# Patient Record
Sex: Female | Born: 1965 | Race: White | Hispanic: No | Marital: Married | Smoking: Never smoker
Health system: Southern US, Community
[De-identification: ages and names within clinical notes are randomized; demographics above are authoritative.]

## PROBLEM LIST (undated history)

## (undated) DIAGNOSIS — J45909 Unspecified asthma, uncomplicated: Secondary | ICD-10-CM

## (undated) DIAGNOSIS — Z9109 Other allergy status, other than to drugs and biological substances: Secondary | ICD-10-CM

## (undated) DIAGNOSIS — Z91018 Allergy to other foods: Secondary | ICD-10-CM

## (undated) DIAGNOSIS — Z79890 Hormone replacement therapy (postmenopausal): Secondary | ICD-10-CM

## (undated) DIAGNOSIS — R112 Nausea with vomiting, unspecified: Secondary | ICD-10-CM

## (undated) DIAGNOSIS — T8859XA Other complications of anesthesia, initial encounter: Secondary | ICD-10-CM

## (undated) DIAGNOSIS — N809 Endometriosis, unspecified: Secondary | ICD-10-CM

## (undated) DIAGNOSIS — F419 Anxiety disorder, unspecified: Secondary | ICD-10-CM

## (undated) HISTORY — PX: ABDOMINAL HYSTERECTOMY: SHX81

## (undated) HISTORY — PX: DILATION AND CURETTAGE OF UTERUS: SHX78

## (undated) HISTORY — PX: LAPAROSCOPY: SHX197

## (undated) HISTORY — PX: CERVICAL DISCECTOMY: SHX98

## (undated) HISTORY — PX: TONSILLECTOMY: SUR1361

---

## 1997-12-29 ENCOUNTER — Ambulatory Visit (HOSPITAL_COMMUNITY): Admission: RE | Admit: 1997-12-29 | Discharge: 1997-12-29 | Payer: Self-pay | Admitting: *Deleted

## 1998-05-27 ENCOUNTER — Other Ambulatory Visit: Admission: RE | Admit: 1998-05-27 | Discharge: 1998-05-27 | Payer: Self-pay | Admitting: *Deleted

## 1998-12-14 ENCOUNTER — Inpatient Hospital Stay (HOSPITAL_COMMUNITY): Admission: AD | Admit: 1998-12-14 | Discharge: 1998-12-17 | Payer: Self-pay | Admitting: *Deleted

## 1998-12-14 ENCOUNTER — Encounter (INDEPENDENT_AMBULATORY_CARE_PROVIDER_SITE_OTHER): Payer: Self-pay

## 1999-07-04 ENCOUNTER — Other Ambulatory Visit: Admission: RE | Admit: 1999-07-04 | Discharge: 1999-07-04 | Payer: Self-pay | Admitting: *Deleted

## 1999-07-07 ENCOUNTER — Encounter: Payer: Self-pay | Admitting: *Deleted

## 1999-07-07 ENCOUNTER — Ambulatory Visit (HOSPITAL_COMMUNITY): Admission: RE | Admit: 1999-07-07 | Discharge: 1999-07-07 | Payer: Self-pay | Admitting: *Deleted

## 1999-11-14 ENCOUNTER — Encounter (INDEPENDENT_AMBULATORY_CARE_PROVIDER_SITE_OTHER): Payer: Self-pay

## 1999-11-14 ENCOUNTER — Other Ambulatory Visit: Admission: RE | Admit: 1999-11-14 | Discharge: 1999-11-14 | Payer: Self-pay | Admitting: Otolaryngology

## 2000-06-29 ENCOUNTER — Observation Stay (HOSPITAL_COMMUNITY): Admission: AD | Admit: 2000-06-29 | Discharge: 2000-06-30 | Payer: Self-pay | Admitting: *Deleted

## 2000-06-29 ENCOUNTER — Encounter (INDEPENDENT_AMBULATORY_CARE_PROVIDER_SITE_OTHER): Payer: Self-pay

## 2001-01-01 ENCOUNTER — Other Ambulatory Visit: Admission: RE | Admit: 2001-01-01 | Discharge: 2001-01-01 | Payer: Self-pay | Admitting: *Deleted

## 2002-01-30 ENCOUNTER — Other Ambulatory Visit: Admission: RE | Admit: 2002-01-30 | Discharge: 2002-01-30 | Payer: Self-pay | Admitting: *Deleted

## 2004-03-10 ENCOUNTER — Encounter: Admission: RE | Admit: 2004-03-10 | Discharge: 2004-03-10 | Payer: Self-pay | Admitting: Obstetrics and Gynecology

## 2005-04-05 ENCOUNTER — Encounter: Admission: RE | Admit: 2005-04-05 | Discharge: 2005-04-05 | Payer: Self-pay | Admitting: Obstetrics and Gynecology

## 2006-07-03 ENCOUNTER — Encounter: Admission: RE | Admit: 2006-07-03 | Discharge: 2006-07-03 | Payer: Self-pay | Admitting: Obstetrics and Gynecology

## 2010-02-07 ENCOUNTER — Encounter: Payer: Self-pay | Admitting: Obstetrics and Gynecology

## 2010-06-03 NOTE — Op Note (Signed)
[facility]  Patient:    Sophia Blair, Sophia Blair                    MRN: [id number] Proc. Date: 06/29/00 Adm. Date:  [id number] Attending:  Pleas Koch                           Operative Report  PREOPERATIVE DIAGNOSES:       Endometriosis, pelvic pain, dyspareunia.  POSTOPERATIVE DIAGNOSES:      Endometriosis, pelvic pain, dyspareunia.  OPERATION PERFORMED:          Laparoscopically assisted vaginal hysterectomy, bilateral salpingo-oophorectomy.  SURGEON:                      Georgina Peer, M.D.  ASSISTANT:                    Rudy Jew. Ashley Royalty, M.D.  ANESTHESIA:                   General.  BLOOD LOSS:                   100 cc.  COMPLICATIONS:                None.  INDICATIONS:                  This is a 45 year old gravida 2, para 2, with a history of endometriosis dating back to 24.  The patient had a repeat laparoscopy in 1999 with treatment of endometriosis of the left ovary and left ovarian fossa.  After having a delivery in 2001, Lupron Depot was given to control symptoms but after the Lupron Depot wore off, the patients symptoms returned.  She elected aggressive surgical approach for management as she did not want further children.  For details, see history and physical.  DESCRIPTION OF PROCEDURE:     The patient was taken to the operating room, given a general anesthetic and placed in dorsal lithotomy position with the legs in Allen stirrups.  PAS stockings were applied.  Abdomen and vagina were prepped and draped sterilely.  Her bladder was emptied with a catheter.  The patient had then a vertical subumbilical incision made, Veress needle placed into the peritoneal cavity, insufflation after a normal water-drop test under low pressures of 2.5 L of CO2 created a pneumoperitoneum.  Laparoscopic trocar and sleeve were placed and then under direct vision, 10-11 trocars were placed in the right and left lower quadrants and a 5-mm  trocar was placed in the midline.  The following pelvic findings were noted:  There appeared to be no injury from laparoscopic trocar placement.  The uterus appeared normal and mobile.  There was no evidence of endometriosis in the anterior bladder flap. Some scarring in the cul-de-sac was noted and scarring in the left ovarian fossa with retraction pockets in the left ovarian fossa.  Ovaries were not enlarged and were not adherent to the pelvic sidewall.  Using a Ligasure laparoscopic 10-mm instrument, the right infundibulopelvic vessels were seen free of the ureter, which was fully identified.  The infundibulopelvic vessels were ligated with the Ligasure device, the round ligaments and cardinal ligaments ligated with the Ligasure device.  An anterior bladder flap was created with the Ligasure device.  Similar identification of the left infundibulopelvic ligaments free from the left ureter and free from the reflection of the  rectosigmoid was then performed and the vessels sealed with the Ligasure progressively down to the level of the bladder flap.  The bladder flap was then created; the laparoscopic portion of the surgery was then suspended.  The vaginal approach was then begun, injecting the vaginal mucosa with a total of 15 cc of a dilute Neo-Synephrine solution.  Cautery was used to incise the vaginal mucosa.  The cul-de-sac was entered posteriorly.  A retractor was placed.  The uterosacral ligaments were divided with the Ligasure device. Anteriorly, the anterior vaginal mucosa and bladder were pushed up and the peritoneum was identified from the previous bladder flap that was created laparoscopically.  The bladder was then retracted anteriorly and cephalad. The uterine vessels bilaterally were sealed with the Ligasure device.  The broad ligament attachments of the uterus were then sealed with the Ligasure device and the uterus, tubes and ovaries were removed.  Small bleeders  along the vaginal cuff were sealed with the Ligasure device.  Uterosacral ligaments were then sutured.  The posterior cuff was whipstitched.  There was excellent hemostasis.  The vaginal cuff was reapproximated with Vicryl suture to close the cuff.  There was excellent hemostasis again.  The uterosacral ligaments were then tied together.  Inspection laparoscopically for any bleeders after the vaginal cuff was closed was then accomplished.  Irrigation of any blood and debris was accomplished.  All pedicles appeared hemostatic.  There was no bleeding.  All blood and debris were removed.  The appendix was visualized and found to be normal.  Photo-documentation of these findings was made and then the laparoscope and ports were removed.  The 10-mm ports fasciae were closed with a Vicryl suture and the skin was closed subcuticular in individual sutures of Vicryl.  A Foley catheter was placed.  Sponge, needle and instrument counts were correct.  The patient did receive Ancef 1 g preoperatively. DD:  06/29/00 TD:  06/29/00 Job: [id number]

## 2010-06-03 NOTE — Discharge Summary (Signed)
[facility]  Patient:    Sophia Blair, Sophia Blair                    MRN: [id number] Adm. Date:  [id number] Disc. Date: [id number] Attending:  Pleas Koch                           Discharge Summary  ADMISSION DIAGNOSES:          1. Endometriosis.                               2. Dyspareunia.                               3. Pelvic pain.  DISCHARGE DIAGNOSES:          1. Endometriosis.                               2. Dyspareunia.                               3. Pelvic pain.  BRIEF HISTORY:                This is a gravida 2, para 2, with a long history of endometriosis failing medical therapy with Lupron Depot, desired aggressive and complete management of her problem. She had previously had a laparoscopy in 1999. She was admitted and underwent a laparoscopically assisted vaginal hysterectomy with bilateral salpingo-oophorectomy under general anesthetic on June 14 with blood loss of 100 cc. There were no complications. Postoperatively, the patients catheter was removed on the evening of the day of surgery and she voided spontaneously. She was taking liquids, and by the morning of the first day she was taking solids. She continued on PCA and IM Toradol until the morning of the first postoperative day. Her postoperative hemoglobin was 11.6. She was passing flatus, she had no distention, nausea or vomiting, and appropriate shoulder pain for having a laparoscopy. She was having minimal vaginal bleeding. She was ambulating without lightheadedness or dizziness, and postoperative hemoglobin, again, 11.6 from preoperative of 14.1. The patient had her PCA pumped stopped on the morning of the first day and by the afternoon of the first day, June 15, was taking oral analgesics, tolerating them well; again, tolerating solid food and was ready for discharge.  DISPOSITION:                  She was discharged in good condition.  DISCHARGE MEDICATIONS:         She was given a prescription for Percocet, Ibuprofen over-the-counter. A Vivelle 0.1 mg patch was applied on the first day and this will be changed every three days. A prescription was written for this.  DISCHARGE INSTRUCTIONS:       She was given detailed written instructions to call for fever, heavy bleeding, increased pain, and inability to void or problems with her incision.  DISCHARGE FOLLOWUP:           She will follow up in the office in two weeks. DD:  07/01/00 TD:  07/01/00 Job: [id number]

## 2010-06-03 NOTE — H&P (Signed)
[facility]  Patient:    Sophia Blair, Sophia Blair                      MRN: [id number] Adm. Date:  06/29/00 Attending:  Georgina Peer, M.D. CC:         Day Surgery   History and Physical  ADMITTING DIAGNOSIS:          Endometriosis, pelvic pain, dyspareunia.  HISTORY OF PRESENT ILLNESS:   This is a 45 year old female who has a known history of endometriosis.  She had a laparoscopy in 1999 for pelvic pain with left ovarian adhesions and endometriosis that was vaporized.  The patient subsequently got pregnant and had a baby.  She had increasing pelvic tenderness.  She was placed on Depo-Lupron, but this has not improved her symptoms.  The patient desires aggressive and definitive treatment for this problem and is admitted for LAVH-BSO.  The patient had been previously on oral contraceptives and nonsteroidal anti-inflammatory agents but continued to have severe left-sided pain and severe dysmenorrhea.  Also, intercourse was increasingly uncomfortable and disruptive.  PAST MEDICAL HISTORY:         She is a gravida 2, para 2.  The patient has no history of thyroid disease or breast disease.  The patient had her first diagnosis of endometriosis in 1994.  SOCIAL HISTORY:               The patient does not smoke cigarettes, social alcohol, and no recreational drugs.  FAMILY HISTORY:               Positive for diabetes, hypertension, heart disease, heart attack.  ALLERGIES:                    She has seasonal allergies.  The patient is allergic to sulfa,  shellfish, and tomatoes.  REVIEW OF SYSTEMS:            Otherwise is negative and there is no personal history of hypertension, diabetes, heart, lung, or kidney disease.  The patient has no history of STDs.  She has had a recent Pap smear which was normal in June 2001.  PHYSICAL EXAMINATION:  GENERAL:                      A well-developed white female.  VITAL SIGNS:                  Blood pressure  94/62, weight 115, height 5 feet, 4-1/2 inches.  HEENT:                        Within normal limits.  NECK:                         Without thyromegaly or JVD.  CHEST:                        Clear.  BREASTS:                      Normal.  ABDOMEN:                      Normal with left lower quadrant tenderness.  No rebound, mass, or guarding.  No CVA tenderness.  EXTREMITIES:  Without cyanosis or edema.  NEUROLOGIC:                   Grossly normal.  PELVIC:                       EG, BUS normal.  Vagina normal.  Cervix nontender with no lesions.  Uterus is tender in the cul de sac, tenderness on the anterior and posterior surfaces, but the uterus is mobile.  Left adnexa is tender.  There are no masses, no nodularity or fixation.  RECTOVAGINAL:                 Normal without nodularity.  ADMISSION DIAGNOSIS:          Recurrent endometriosis failing medical management, pelvic pain, dyspareunia, and dysmenorrhea.  PLAN:                         The patient is admitted for laparoscopically assisted vaginal hysterectomy, bilateral salpingo-oophorectomy.  She is aware of the risks and complications of procedure including risk of laparoscopy, bowel, bladder, or vascular injury, the risks of bleeding or infection, the risks of fistula formation.  The chance that pain may not be completely resolved or, actually, may be worse after the surgery, that endometriosis could recur even after ovarian removal and that hormone replacement therapy may be a recommended postoperative path.  She accepts these and is willing to proceed.  Will proceed with surgery at [facility] on June 14 at 8:45 a.m. DD:  06/28/00 TD:  06/28/00 Job: [id number]

## 2016-12-20 ENCOUNTER — Encounter: Payer: Self-pay | Admitting: Podiatry

## 2016-12-20 ENCOUNTER — Ambulatory Visit: Payer: BLUE CROSS/BLUE SHIELD | Admitting: Podiatry

## 2016-12-20 VITALS — BP 131/99 | HR 87 | Resp 16

## 2016-12-20 DIAGNOSIS — L600 Ingrowing nail: Secondary | ICD-10-CM | POA: Diagnosis not present

## 2016-12-20 NOTE — Progress Notes (Signed)
   Subjective:    Patient ID: Sophia Blair, female    DOB: 07/02/1965, 51 y.o.   MRN: [id number]  HPI    Review of Systems  All other systems reviewed and are negative.      Objective:   Physical Exam        Assessment & Plan:

## 2016-12-20 NOTE — Progress Notes (Signed)
Subjective:   Patient ID: Sophia Blair, female   DOB: 51 y.o.   MRN: [id number]   HPI Patient presents with chronic ingrown toenails of the big toes bilateral stating they have been sore and making it hard for her to wear shoe gear comfortably.  She is tried to trim them without relief of symptoms and they are very painful when palpated   Review of Systems  All other systems reviewed and are negative.       Objective:  Physical Exam  Constitutional: She appears well-developed and well-nourished.  Cardiovascular: Intact distal pulses.  Pulmonary/Chest: Effort normal.  Musculoskeletal: Normal range of motion.  Neurological: She is alert.  Skin: Skin is warm.  Nursing note and vitals reviewed.   Neurovascular status intact muscle strength adequate range of motion within normal limits with patient found to have incurvated hallux nails of both feet both medial lateral borders they are very painful when pressed.  Patient has slight distal redness but no active drainage or no proximal edema erythema or drainage noted and patient was found to have good digital perfusion and is well oriented x3.  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  Patient wants procedure and I explained the risks associated with surgery and patient had each hallux infiltrated 60 mg like Marcaine mixture and under sterile conditions I removed the medial lateral borders exposed matrix and applied phenol 3 applications 30 seconds followed by alcohol lavage sterile dressing.  Gave instructions on soaks and reappoint as needed and is encouraged to call with any questions concerning this procedure

## 2016-12-20 NOTE — Patient Instructions (Signed)

## 2017-03-02 ENCOUNTER — Other Ambulatory Visit: Payer: Self-pay

## 2017-03-02 ENCOUNTER — Encounter (HOSPITAL_BASED_OUTPATIENT_CLINIC_OR_DEPARTMENT_OTHER): Payer: Self-pay | Admitting: Emergency Medicine

## 2017-03-02 ENCOUNTER — Emergency Department (HOSPITAL_BASED_OUTPATIENT_CLINIC_OR_DEPARTMENT_OTHER)
Admission: EM | Admit: 2017-03-02 | Discharge: 2017-03-02 | Disposition: A | Discharge status: Home / Self Care | Payer: BLUE CROSS/BLUE SHIELD | Attending: Emergency Medicine | Admitting: Emergency Medicine

## 2017-03-02 ENCOUNTER — Emergency Department (HOSPITAL_BASED_OUTPATIENT_CLINIC_OR_DEPARTMENT_OTHER): Payer: BLUE CROSS/BLUE SHIELD

## 2017-03-02 DIAGNOSIS — Z79899 Other long term (current) drug therapy: Secondary | ICD-10-CM | POA: Insufficient documentation

## 2017-03-02 DIAGNOSIS — J45909 Unspecified asthma, uncomplicated: Secondary | ICD-10-CM | POA: Diagnosis not present

## 2017-03-02 DIAGNOSIS — I808 Phlebitis and thrombophlebitis of other sites: Secondary | ICD-10-CM | POA: Diagnosis not present

## 2017-03-02 DIAGNOSIS — R2232 Localized swelling, mass and lump, left upper limb: Secondary | ICD-10-CM | POA: Diagnosis present

## 2017-03-02 HISTORY — DX: Allergy to other foods: Z91.018

## 2017-03-02 HISTORY — DX: Other allergy status, other than to drugs and biological substances: Z91.09

## 2017-03-02 HISTORY — DX: Hormone replacement therapy: Z79.890

## 2017-03-02 HISTORY — DX: Unspecified asthma, uncomplicated: J45.909

## 2017-03-02 HISTORY — DX: Endometriosis, unspecified: N80.9

## 2017-03-02 MED ORDER — IBUPROFEN 400 MG PO TABS
600.0000 mg | ORAL_TABLET | Freq: Once | ORAL | Status: AC
  Administered 2017-03-02: 600 mg via ORAL
  Filled 2017-03-02: qty 1

## 2017-03-02 NOTE — Discharge Instructions (Signed)
Please see the information and instructions below regarding your visit.  Your diagnoses today include:  1. Superficial thrombophlebitis of left upper extremity    Superficial thrombus vulvitis occurs when the vein becomes irritated and forms a small clot.  It will resolve on its own, however the treatment is conservative with ibuprofen and warm compresses.  He may also use cool compresses for comfort.  Tests performed today include: See side panel of your discharge paperwork for testing performed today. Vital signs are listed at the bottom of these instructions.   Upper extremity ultrasound shows no evidence of deep vein thrombosis in your arm.  Medications prescribed:    Take any prescribed medications only as prescribed, and any over the counter medications only as directed on the packaging.  You are prescribed ibuprofen, a non-steroidal anti-inflammatory agent (NSAID) for pain. You may take 600mg  every 6 hours as needed for pain. If still requiring this medication around the clock for acute pain after 10 days, please see your primary healthcare provider.  Women who are pregnant, breastfeeding, or planning on becoming pregnant should not take non-steroidal anti-inflammatories such as   You may combine this medication with Tylenol, 650 mg every 6 hours, so you are receiving something for pain every 3 hours.  This is not a long-term medication unless under the care and direction of your primary provider. Taking this medication long-term and not under the supervision of a healthcare provider could increase the risk of stomach ulcers, kidney problems, and cardiovascular problems such as high blood pressure.   Home care instructions:  Please follow any educational materials contained in this packet.   Please follow the instructions included in the information packet.  These include doing warm compresses several times a day.  Follow-up instructions: Please follow-up with your primary care  provider in one week for further evaluation of your symptoms if they are not completely improved.   Return instructions:  Please return to the Emergency Department if you experience worsening symptoms.  Please return to the emergency department for any fevers, chills, increasing redness around the site, increasing swelling, swelling of the entire left arm, or decreased sensation farther down from the site that is affected. Please return if you have any other emergent concerns.  Additional Information:   Your vital signs today were: BP (!) 149/87 (BP Location: Right Arm)    Pulse 71    Temp 97.6 F (36.4 C) (Oral)    Resp 18    Ht 5\' 5"  (1.651 m)    Wt 63.5 kg (140 lb)    SpO2 100%    BMI 23.30 kg/m  If your blood pressure (BP) was elevated on multiple readings during this visit above 130 for the top number or above 80 for the bottom number, please have this repeated by your primary care provider within one month. --------------  Thank you for allowing us to participate in your care today.

## 2017-03-02 NOTE — ED Triage Notes (Signed)
[facility].  Pain and tenderness at site and throbbing in entire arm.

## 2017-03-02 NOTE — ED Provider Notes (Signed)
[facility] EMERGENCY DEPARTMENT Provider Note   CSN: [id number] Arrival date & time: 03/02/17  0910     History   Chief Complaint Chief Complaint  Patient presents with  . IV site problem    HPI Sophia Blair is a 52 y.o. female.  HPI   Patient is a 52 year old female with a history of asthma, endometriosis, environmental allergies presenting for a left arm swelling and tenderness.  Patient reports that she had an IV placed in her left antecubital region approximately 9 days ago.  Patient reports that she receives vitamin infusions monthly at them many spot, but typically does not have the IV placed in the particular vein where she is experiencing the tenderness surrounding.  Patient reports that she is noticed over the past 48 hours that there is been increased redness around the site.  Patient reports the site is tender to touch.  Patient denies any fevers, chills, chest pain, shortness of breath, nausea, vomiting.  Patient denies any swelling of the extremities.  Patient denies any history of similar.  Patient reports she took aspirin 12 hours ago to relieve her symptoms, but continued to have the swelling and pain.  No history of DVT/PE.  Past Medical History:  Diagnosis Date  . Asthma   . Endometriosis   . Environmental allergies   . Hormone replacement therapy   . Multiple food allergies    Tomato, shellfish    There are no active problems to display for this patient.   Past Surgical History:  Procedure Laterality Date  . ABDOMINAL HYSTERECTOMY    . DILATION AND CURETTAGE OF UTERUS    . LAPAROSCOPY    . TONSILLECTOMY      OB History    No data available       Home Medications    Prior to Admission medications   Medication Sig Start Date End Date Taking? Authorizing Provider  progesterone (PROMETRIUM) 100 MG capsule Take 100 mg by mouth daily.   Yes [provider]  Ascorbic Acid (VITAMIN C PO) Take 1 tablet by mouth daily.     [provider]  Calcium Carbonate-Vitamin D 600-400 MG-UNIT tablet Take by mouth. 11/24/08   [provider]  Cyanocobalamin (VITAMIN B-12 PO) Take 1 tablet by mouth daily.    [provider]  estrogens, conjugated, (PREMARIN) 0.3 MG tablet Every three (3) months. Estrogen/progesterone/testosterone pellet implantation every 3 months 01/31/11   [provider]  levocetirizine (XYZAL) 5 MG tablet Take 5 mg by mouth.    [provider]  liothyronine (CYTOMEL) 5 MCG tablet Take by mouth.    [provider]  meloxicam (MOBIC) 15 MG tablet Take 15 mg by mouth. 11/27/16 11/27/17  [provider]  montelukast (SINGULAIR) 10 MG tablet Take 10 mg by mouth.    [provider]  Multiple Vitamins-Minerals (COMPLETE) TABS Take by mouth. 11/24/08   [provider]  Omega-3 Fatty Acids (FISH OIL PO) Take 1 tablet by mouth daily.    [provider]  PROAIR RESPICLICK 108 (90 Base) MCG/ACT AEPB INHALE 2 PUFF BY MOUTH AS NEEDED EVERY 4-6 HOURS AS NEEDED FOR COUGH/WHEEZE 12/12/16   [provider]    Family History No family history on file.  Social History Social History   Tobacco Use  . Smoking status: Never Smoker  . Smokeless tobacco: Never Used  Substance Use Topics  . Alcohol use: Yes    Alcohol/week: 1.2 oz    Types: 2  Glasses of wine per week    Comment: 2 glasses /night  . Drug use: No     Allergies   Shellfish allergy; Tomato; Dust mite extract; and Sulfa antibiotics   Review of Systems Review of Systems  Constitutional: Negative for chills and fever.  HENT: Negative for congestion and rhinorrhea.   Respiratory: Negative for chest tightness and shortness of breath.   Cardiovascular: Negative for chest pain and leg swelling.  Musculoskeletal: Positive for myalgias.  Skin: Positive for color change. Negative for rash.  Neurological: Negative for weakness and numbness.  All other systems  reviewed and are negative.    Physical Exam Updated Vital Signs BP (!) 142/93 (BP Location: Right Arm)   Pulse 75   Temp 97.6 F (36.4 C) (Oral)   Resp 16   Ht 5\' 5"  (1.651 m)   Wt 63.5 kg (140 lb)   SpO2 100%   BMI 23.30 kg/m   Physical Exam  Constitutional: She appears well-developed and well-nourished. No distress.  HENT:  Head: Normocephalic and atraumatic.  Mouth/Throat: Oropharynx is clear and moist.  Eyes: Conjunctivae and EOM are normal. Pupils are equal, round, and reactive to light.  Neck: Normal range of motion. Neck supple.  Cardiovascular: Normal rate, regular rhythm, S1 normal, S2 normal and intact distal pulses.  No murmur heard. Intact, 2+ DP and PT pulses of upper extremities bilaterally.  Pulmonary/Chest: Effort normal and breath sounds normal. She has no wheezes. She has no rales.  Abdominal: She exhibits no distension.  Musculoskeletal: Normal range of motion. She exhibits no edema or deformity.  Neurological: She is alert.  Cranial nerves grossly intact. Patient moves extremities symmetrically and with good coordination.  Skin: Skin is warm and dry. There is erythema.  There is a well-circumscribed, indurated area of the left medial antecubital fossa.  There is  Psychiatric: She has a normal mood and affect. Her behavior is normal. Judgment and thought content normal.  Nursing note and vitals reviewed.    ED Treatments / Results  Labs (all labs ordered are listed, but only abnormal results are displayed) Labs Reviewed - No data to display  EKG  EKG Interpretation None       Radiology No results found.  Procedures Procedures (including critical care time)  Medications Ordered in ED Medications  ibuprofen (ADVIL,MOTRIN) tablet 600 mg (600 mg Oral Given 03/02/17 1010)     Initial Impression / Assessment and Plan / ED Course  I have reviewed the triage vital signs and the nursing notes.  Pertinent labs & imaging results that were  available during my care of the patient were reviewed by me and considered in my medical decision making (see chart for details).     Patient is nontoxic-appearing, afebrile, and in no acute distress.  Differential diagnosis includes superficial thrombophlebitis versus DVT.  Do not suspect abscess or cellulitis overlying the site, as there is no fluctuance, induration, or well circumcised area of erythema around the affected area.  We will proceed to upper extremity ultrasound to rule out deep venous clotting.  Ibuprofen for prophylactic superficial thrombophlebitis treatment.  Right upper extremities ultrasound demonstrates no evidence of DVT.  Demonstrate evidence of superficial clot in the basilic vein.  Instructed patient to take ibuprofen every 6 hours and apply warm compresses to the site.  Patient to stop Mobic while on this therapy.  Patient return precautions for any increased redness, numbness distal to the site, swelling of the upper extremity, or fever or chills.  Patient is in understanding agrees with plan of care.  This is a shared visit with Dr. Gwenith Spitz. Patient was independently evaluated by this attending physician. Attending physician consulted in evaluation and discharge management.   Final Clinical Impressions(s) / ED Diagnoses   Final diagnoses:  Superficial thrombophlebitis of left upper extremity    ED Discharge Orders    None       Delia Chimes 03/02/17 1649    Tilden Fossa, MD 03/03/17 1312

## 2017-05-25 ENCOUNTER — Ambulatory Visit
Admission: RE | Admit: 2017-05-25 | Discharge: 2017-05-25 | Disposition: A | Discharge status: Home / Self Care | Payer: BLUE CROSS/BLUE SHIELD | Source: Ambulatory Visit | Attending: Allergy and Immunology | Admitting: Allergy and Immunology

## 2017-05-25 ENCOUNTER — Other Ambulatory Visit: Payer: Self-pay | Admitting: Allergy and Immunology

## 2017-05-25 DIAGNOSIS — J45990 Exercise induced bronchospasm: Secondary | ICD-10-CM

## 2018-12-28 IMAGING — US US EXTREM  UP VENOUS*L*
1 series · 14 of 24 positions shown · non-contrast
Comparison: None.

CLINICAL DATA: Left upper extremity pain

EXAM:
LEFT UPPER EXTREMITY VENOUS DUPLEX ULTRASOUND
TECHNIQUE: Doppler venous assessment of the left upper extremity deep venous
system was performed, including characterization of spectral flow,
compressibility, and phasicity.

[Series 1: us extrem up venous*left* · 14 of 38 slices shown]
[im 1/38]
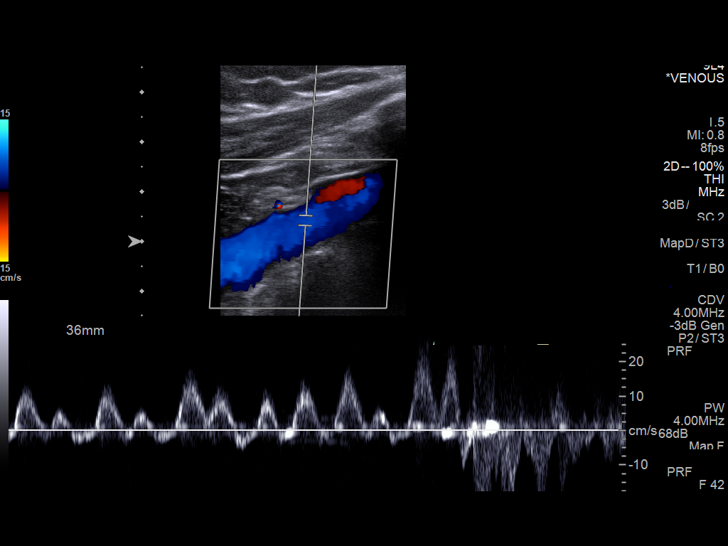
[im 4/38]
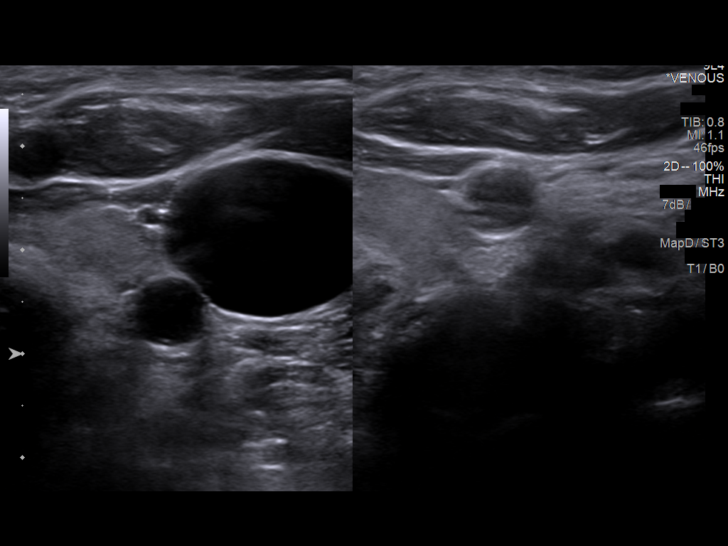
[im 7/38]
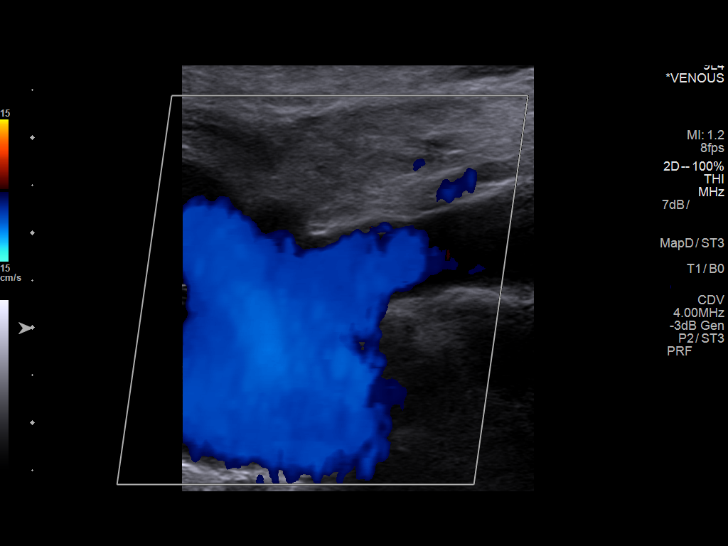
[im 10/38]
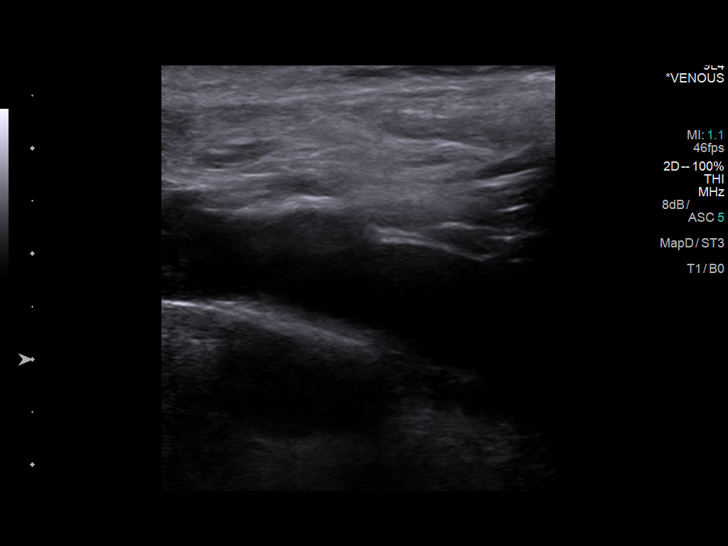
[im 12/38]
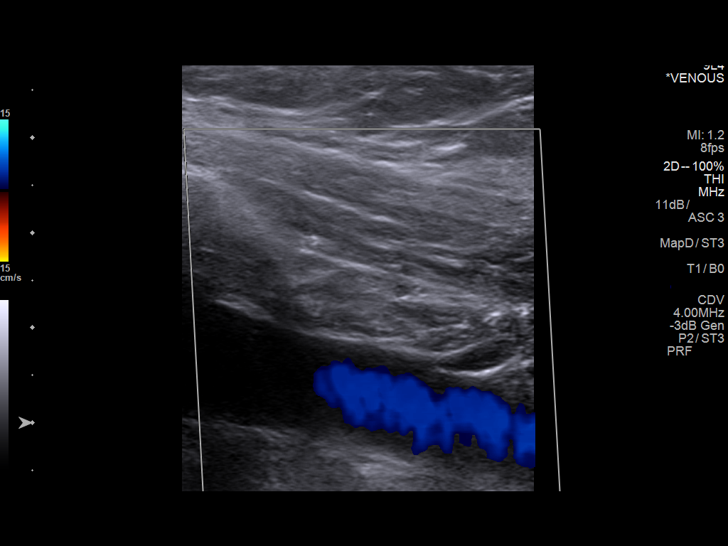
[im 15/38]
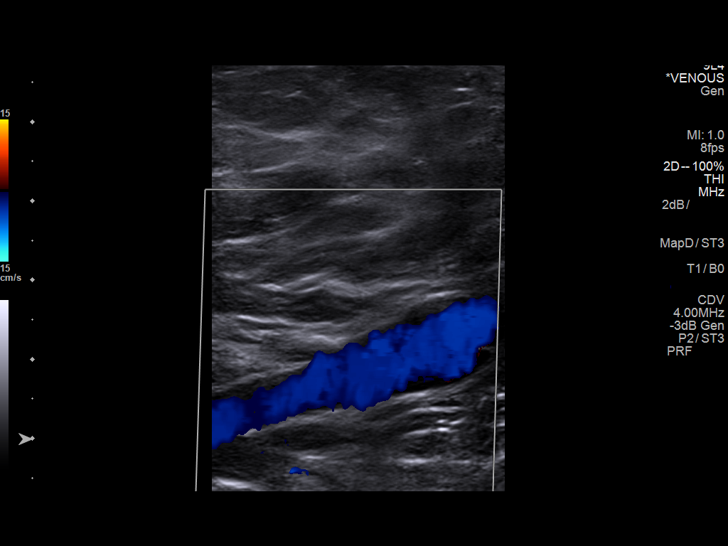
[im 18/38]
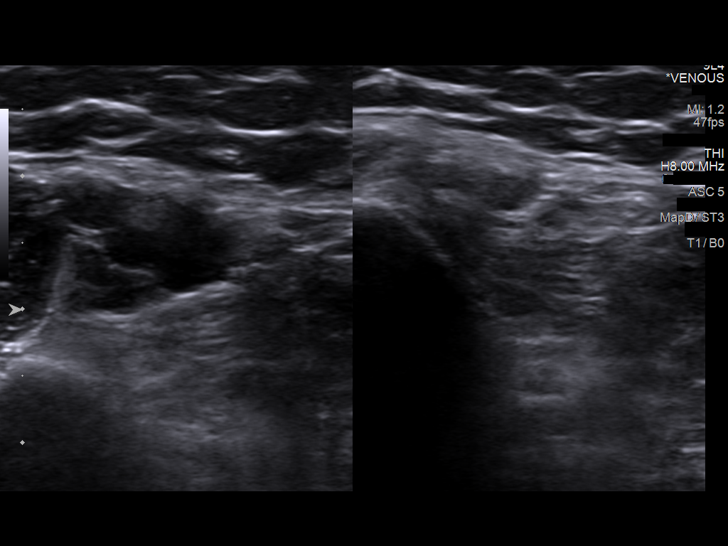
[im 20/38]
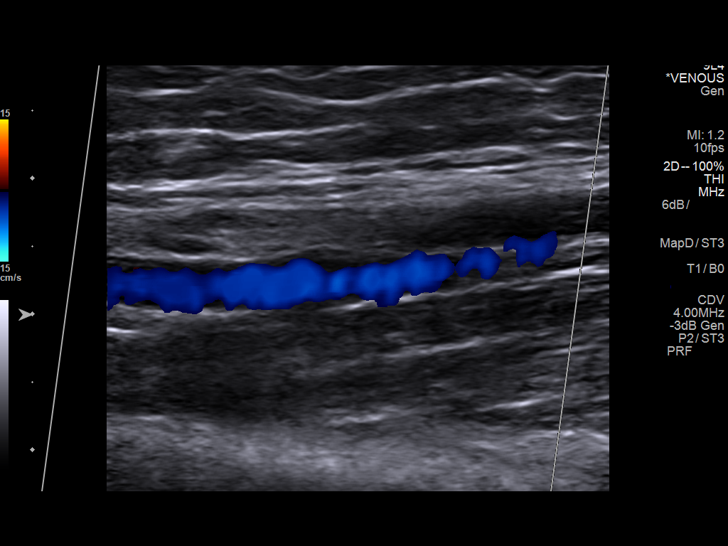
[im 23/38]
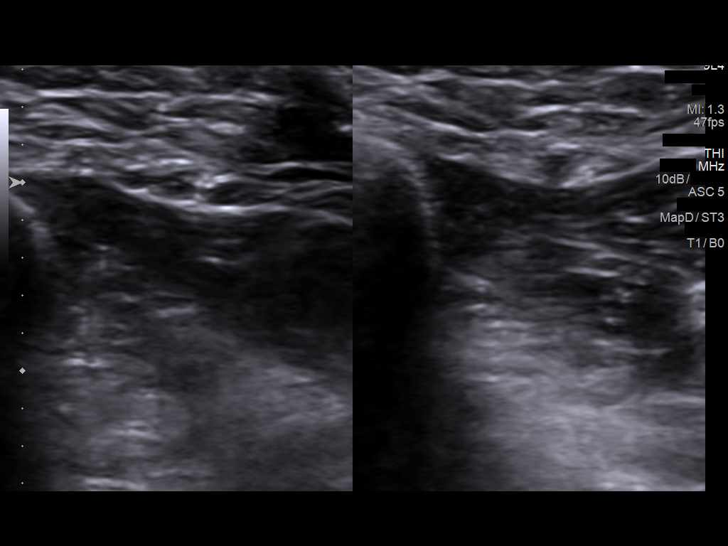
[im 26/38]
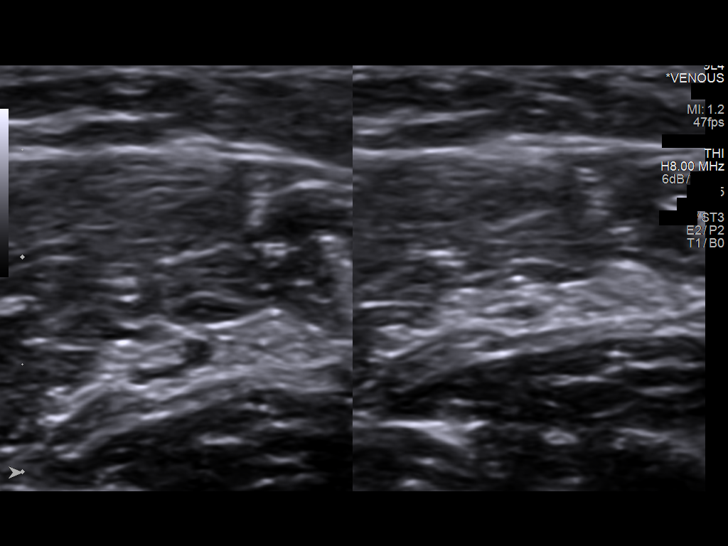
[im 29/38]
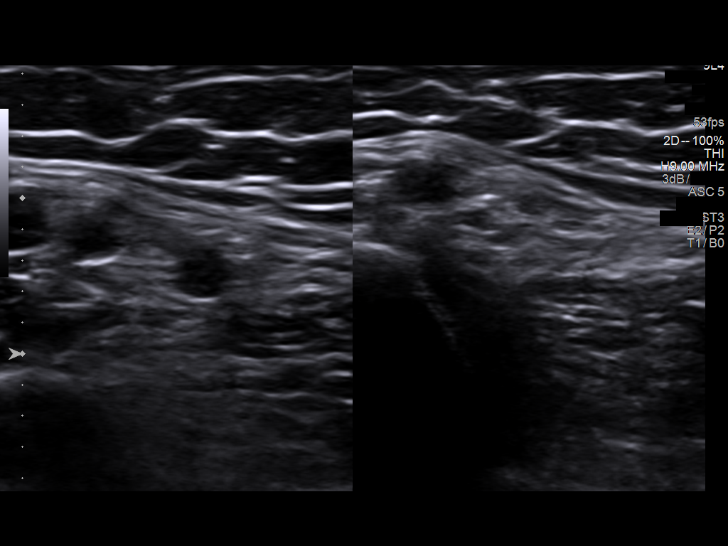
[im 31/38]
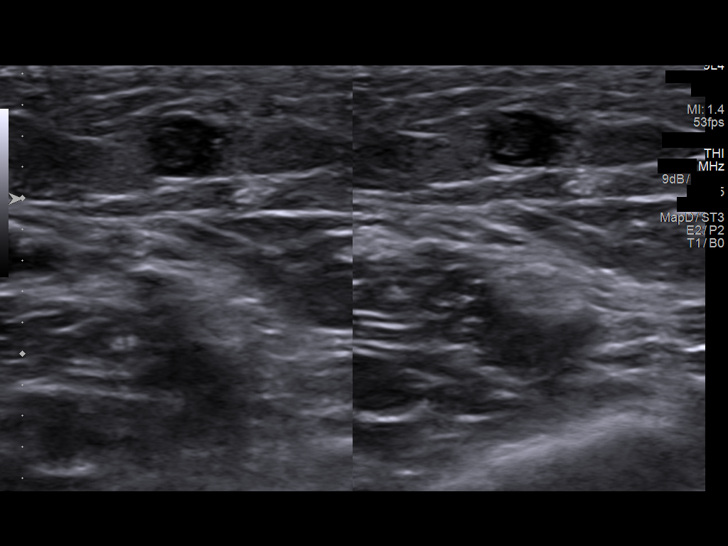
[im 34/38]
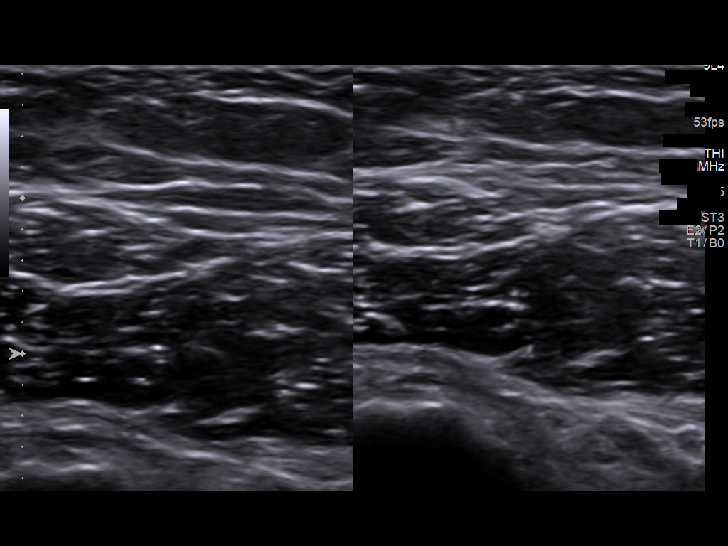
[im 38/38]
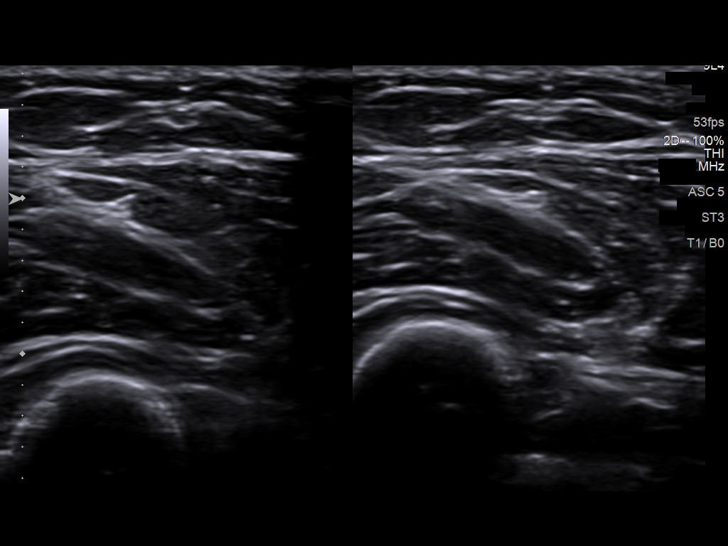

[14 of 24 positions shown; findings below may reference images not displayed]

FINDINGS: The left jugular vein is compressible and widely patent by color
flow and Doppler waveform imaging.

The left subclavian vein is widely patent by color flow and Doppler
waveform imaging. It is also completely compressible.

The left axillary, brachial, radial, and ulnar veins are
compressible and patent.

A short segment of the basilic vein is noncompressible and occluded
from the mid arm to the elbow. This is consistent with superficial
thrombophlebitis.
IMPRESSION: No evidence of left upper extremity DVT.

The study is positive for superficial vein thrombosis in the basilic
vein within the distal arm.

## 2019-03-22 ENCOUNTER — Ambulatory Visit: Payer: Managed Care, Other (non HMO) | Attending: Internal Medicine

## 2019-03-22 DIAGNOSIS — Z23 Encounter for immunization: Secondary | ICD-10-CM | POA: Insufficient documentation

## 2019-03-22 NOTE — Progress Notes (Signed)
   Covid-19 Vaccination Clinic  Name:  Sophia Blair    MRN: [id number] DOB: 10-Dec-1965  03/22/2019  [name] was observed post Covid-19 immunization for 15 minutes without incident. She was provided with Vaccine Information Sheet and instruction to access the V-Safe system.   [name] was instructed to call 911 with any severe reactions post vaccine: Marland Kitchen Difficulty breathing  . Swelling of face and throat  . A fast heartbeat  . A bad rash all over body  . Dizziness and weakness   Immunizations Administered    Name Date Dose VIS Date Route   Pfizer COVID-19 Vaccine 03/22/2019 10:06 AM 0.3 mL 12/27/2018 Intramuscular   Manufacturer: [employer], [employer]   Lot: [id number]   NDC: [address]-9

## 2019-04-12 ENCOUNTER — Ambulatory Visit: Payer: Managed Care, Other (non HMO) | Attending: Internal Medicine

## 2019-04-12 DIAGNOSIS — Z23 Encounter for immunization: Secondary | ICD-10-CM

## 2019-04-12 NOTE — Progress Notes (Addendum)
   Covid-19 Vaccination Clinic  Name:  Sophia Blair    MRN: [id number] DOB: Sep 14, 1965  04/12/2019  [name] was observed post Covid-19 immunization for 30 minutes without incident. She was provided with Vaccine Information Sheet and instruction to access the V-Safe system.   [name] was instructed to call 911 with any severe reactions post vaccine: Marland Kitchen Difficulty breathing  . Swelling of face and throat  . A fast heartbeat  . A bad rash all over body  . Dizziness and weakness   Immunizations Administered    Name Date Dose VIS Date Route   Pfizer COVID-19 Vaccine 04/12/2019  8:31 AM 0.3 mL 12/27/2018 Intramuscular   Manufacturer: [employer], [employer]   Lot: [id number]   NDC: [address]-3

## 2019-11-13 ENCOUNTER — Ambulatory Visit: Payer: Managed Care, Other (non HMO) | Attending: Internal Medicine

## 2019-11-13 DIAGNOSIS — Z23 Encounter for immunization: Secondary | ICD-10-CM

## 2019-11-13 NOTE — Progress Notes (Signed)
   Covid-19 Vaccination Clinic  Name:  TERREL MANALO    MRN: [id number] DOB: 1965-03-20  11/13/2019  Ms. Fullman was observed post Covid-19 immunization for 15 minutes without incident. She was provided with Vaccine Information Sheet and instruction to access the V-Safe system.   Ms. Rovira was instructed to call 911 with any severe reactions post vaccine: Marland Kitchen Difficulty breathing  . Swelling of face and throat  . A fast heartbeat  . A bad rash all over body  . Dizziness and weakness

## 2022-07-28 ENCOUNTER — Other Ambulatory Visit: Payer: Self-pay | Admitting: Neurosurgery

## 2022-07-28 DIAGNOSIS — G8929 Other chronic pain: Secondary | ICD-10-CM

## 2022-07-28 DIAGNOSIS — M542 Cervicalgia: Secondary | ICD-10-CM

## 2022-08-13 ENCOUNTER — Ambulatory Visit
Admission: RE | Admit: 2022-08-13 | Discharge: 2022-08-13 | Disposition: A | Discharge status: Home / Self Care | Payer: BC Managed Care – PPO | Source: Ambulatory Visit | Attending: Neurosurgery | Admitting: Neurosurgery

## 2022-08-13 DIAGNOSIS — G8929 Other chronic pain: Secondary | ICD-10-CM

## 2022-08-13 DIAGNOSIS — M542 Cervicalgia: Secondary | ICD-10-CM

## 2022-10-30 ENCOUNTER — Emergency Department (HOSPITAL_COMMUNITY): Payer: BC Managed Care – PPO

## 2022-10-30 ENCOUNTER — Emergency Department (HOSPITAL_COMMUNITY)
Admission: EM | Admit: 2022-10-30 | Discharge: 2022-10-31 | Disposition: A | Discharge status: Home / Self Care | Payer: BC Managed Care – PPO | Attending: Emergency Medicine | Admitting: Emergency Medicine

## 2022-10-30 DIAGNOSIS — G8918 Other acute postprocedural pain: Secondary | ICD-10-CM | POA: Insufficient documentation

## 2022-10-30 DIAGNOSIS — J45909 Unspecified asthma, uncomplicated: Secondary | ICD-10-CM | POA: Diagnosis not present

## 2022-10-30 DIAGNOSIS — M542 Cervicalgia: Secondary | ICD-10-CM | POA: Insufficient documentation

## 2022-10-30 LAB — CBC WITH DIFFERENTIAL/PLATELET
Abs Immature Granulocytes: 0.02 10*3/uL (ref 0.00–0.07)
Basophils Absolute: 0.1 10*3/uL (ref 0.0–0.1)
Basophils Relative: 1 %
Eosinophils Absolute: 0.2 10*3/uL (ref 0.0–0.5)
Eosinophils Relative: 2 %
HCT: 34.5 % — ABNORMAL LOW (ref 36.0–46.0)
Hemoglobin: 11.8 g/dL — ABNORMAL LOW (ref 12.0–15.0)
Immature Granulocytes: 0 %
Lymphocytes Relative: 31 %
Lymphs Abs: 3 10*3/uL (ref 0.7–4.0)
MCH: 32.2 pg (ref 26.0–34.0)
MCHC: 34.2 g/dL (ref 30.0–36.0)
MCV: 94 fL (ref 80.0–100.0)
Monocytes Absolute: 0.8 10*3/uL (ref 0.1–1.0)
Monocytes Relative: 9 %
Neutro Abs: 5.5 10*3/uL (ref 1.7–7.7)
Neutrophils Relative %: 57 %
Platelets: 431 10*3/uL — ABNORMAL HIGH (ref 150–400)
RBC: 3.67 MIL/uL — ABNORMAL LOW (ref 3.87–5.11)
RDW: 12.2 % (ref 11.5–15.5)
WBC: 9.6 10*3/uL (ref 4.0–10.5)
nRBC: 0 % (ref 0.0–0.2)

## 2022-10-30 LAB — BASIC METABOLIC PANEL
Anion gap: 11 (ref 5–15)
BUN: 13 mg/dL (ref 6–20)
CO2: 21 mmol/L — ABNORMAL LOW (ref 22–32)
Calcium: 9 mg/dL (ref 8.9–10.3)
Chloride: 107 mmol/L (ref 98–111)
Creatinine, Ser: 1.05 mg/dL — ABNORMAL HIGH (ref 0.44–1.00)
GFR, Estimated: >60 mL/min (ref >60)
Glucose, Bld: 108 mg/dL — ABNORMAL HIGH (ref 70–99)
Potassium: 4.1 mmol/L (ref 3.5–5.1)
Sodium: 139 mmol/L (ref 135–145)

## 2022-10-30 LAB — MAGNESIUM: Magnesium: 2 mg/dL (ref 1.7–2.4)

## 2022-10-30 MED ORDER — KETOROLAC TROMETHAMINE 15 MG/ML IJ SOLN
15.0000 mg | Freq: Once | INTRAMUSCULAR | Status: AC
  Administered 2022-10-31: 15 mg via INTRAVENOUS
  Filled 2022-10-30: qty 1

## 2022-10-30 MED ORDER — HYDROMORPHONE HCL 1 MG/ML IJ SOLN
1.0000 mg | Freq: Once | INTRAMUSCULAR | Status: AC
  Administered 2022-10-30: 1 mg via INTRAVENOUS
  Filled 2022-10-30: qty 1

## 2022-10-30 MED ORDER — DEXAMETHASONE SODIUM PHOSPHATE 10 MG/ML IJ SOLN
10.0000 mg | Freq: Once | INTRAMUSCULAR | Status: AC
  Administered 2022-10-31: 10 mg via INTRAVENOUS
  Filled 2022-10-30: qty 1

## 2022-10-30 NOTE — ED Triage Notes (Signed)
Pt from home, states neck surgery 5 days ago, has had increased pain x 2 days, worse tonight. Pt c/o 10/10 pain to neck with pain and numbness down both arms. EMS gave fentanyl.

## 2022-10-30 NOTE — ED Provider Notes (Signed)
[place] EMERGENCY DEPARTMENT AT [facility] Provider Note   CSN: [id number] Arrival date & time: 10/30/22  2232     History {Add pertinent medical, surgical, social history, OB history to HPI:1} Chief Complaint  Patient presents with   Post-op Problem    Sophia Blair is a 57 y.o. female.  HPI Patient presents for neck pain.  Medical history includes asthma, endometriosis, cervicalgia.  5 days ago, she underwent C5-6 and C6-7 anterior cervical discectomy, fusion, and interbody prosthesis and anterior plating with Dr. Lovell Sheehan.  Since surgery, she has had progressive pain in these areas of her neck.  She has burning and shooting pains down bilateral arms, to the level of wrist.  She has been taking her prescribed oxycodone, gabapentin, and Flexeril with no relief.  Today, she arrived by EMS.  She was given 200 mcg of fentanyl prior to arrival.  Again, this did not provide relief.  Pain remains 10/10 in severity.  She denies numbness, weakness, fevers, or chills.    Home Medications Prior to Admission medications   Medication Sig Start Date End Date Taking? Authorizing Provider  Ascorbic Acid (VITAMIN C PO) Take 1 tablet by mouth daily.    [provider]  Calcium Carbonate-Vitamin D 600-400 MG-UNIT tablet Take by mouth. 11/24/08   [provider]  Cyanocobalamin (VITAMIN B-12 PO) Take 1 tablet by mouth daily.    [provider]  estrogens, conjugated, (PREMARIN) 0.3 MG tablet Every three (3) months. Estrogen/progesterone/testosterone pellet implantation every 3 months 01/31/11   [provider]  levocetirizine (XYZAL) 5 MG tablet Take 5 mg by mouth.    [provider]  liothyronine (CYTOMEL) 5 MCG tablet Take by mouth.    [provider]  montelukast (SINGULAIR) 10 MG tablet Take 10 mg by mouth.    [provider]  Multiple Vitamins-Minerals (COMPLETE) TABS Take by mouth. 11/24/08   [provider]   Omega-3 Fatty Acids (FISH OIL PO) Take 1 tablet by mouth daily.    [provider]  PROAIR RESPICLICK 108 (90 Base) MCG/ACT AEPB INHALE 2 PUFF BY MOUTH AS NEEDED EVERY 4-6 HOURS AS NEEDED FOR COUGH/WHEEZE 12/12/16   [provider]  progesterone (PROMETRIUM) 100 MG capsule Take 100 mg by mouth daily.    [provider]      Allergies    Shellfish allergy, Tomato, Dust mite extract, and Sulfa antibiotics    Review of Systems   Review of Systems  Musculoskeletal:  Positive for neck pain.  All other systems reviewed and are negative.   Physical Exam Updated Vital Signs BP (!) 161/110 (BP Location: Right Arm)   Pulse (!) 115   Temp 98.2 F (36.8 C) (Oral)   Resp (!) 28   Ht 5\' 5"  (1.651 m)   Wt 64 kg   SpO2 100%   BMI 23.48 kg/m  Physical Exam Vitals and nursing note reviewed.  Constitutional:      General: She is not in acute distress.    Appearance: Normal appearance. She is well-developed. She is not ill-appearing, toxic-appearing or diaphoretic.  HENT:     Head: Normocephalic and atraumatic.     Right Ear: External ear normal.     Left Ear: External ear normal.     Nose: Nose normal.     Mouth/Throat:     Mouth: Mucous membranes are moist.  Eyes:     Extraocular Movements: Extraocular movements intact.     Conjunctiva/sclera: Conjunctivae normal.  Neck:     Comments: Surgical incision on left anterior aspect of neck. Cardiovascular:     Rate and Rhythm: Normal rate and regular rhythm.  Pulmonary:     Effort: Pulmonary effort is normal. No respiratory distress.  Abdominal:     General: There is no distension.     Palpations: Abdomen is soft.     Tenderness: There is no abdominal tenderness.  Musculoskeletal:        General: No swelling or deformity. Normal range of motion.     Cervical back: Normal range of motion and neck supple.  Skin:    General: Skin is warm and dry.     Coloration: Skin is not jaundiced or pale.  Neurological:      General: No focal deficit present.     Mental Status: She is alert and oriented to person, place, and time.     Sensory: No sensory deficit.     Motor: No weakness.  Psychiatric:        Mood and Affect: Mood normal.        Behavior: Behavior normal.     ED Results / Procedures / Treatments   Labs (all labs ordered are listed, but only abnormal results are displayed) Labs Reviewed - No data to display  EKG None  Radiology No results found.  Procedures Procedures  {Document cardiac monitor, telemetry assessment procedure when appropriate:1}  Medications Ordered in ED Medications - No data to display  ED Course/ Medical Decision Making/ A&P   {   Click here for ABCD2, HEART and other calculatorsREFRESH Note before signing :1}                              Medical Decision Making  This patient presents to the ED for concern of ***, this involves an extensive number of treatment options, and is a complaint that carries with it a high risk of complications and morbidity.  The differential diagnosis includes ***   Co morbidities that complicate the patient evaluation  ***   Additional history obtained:  Additional history obtained from *** External records from outside source obtained and reviewed including ***   Lab Tests:  I Ordered, and personally interpreted labs.  The pertinent results include:  ***   Imaging Studies ordered:  I ordered imaging studies including ***  I independently visualized and interpreted imaging which showed *** I agree with the radiologist interpretation   Cardiac Monitoring: / EKG:  The patient was maintained on a cardiac monitor.  I personally viewed and interpreted the cardiac monitored which showed an underlying rhythm of: ***   Consultations Obtained:  I requested consultation with the ***,  and discussed lab and imaging findings as well as pertinent plan - they recommend: ***   Problem List / ED Course / Critical  interventions / Medication management  Patient presents for persistent and worsening neck pain.  She is currently postop day 5 from C5-6 and C6/7 ACDF with Dr. Lovell Sheehan.  On exam, patient appears uncomfortable.  Surgical incision is normal in appearance.  She has no appreciable numbness or weakness in her upper extremities.  She does endorse ongoing shooting pains down both arms.  Dilaudid was ordered for analgesia.  CT imaging was ordered.*** I ordered medication including ***  for ***  Reevaluation of the patient after these medicines showed that the patient {resolved/improved/worsened:23923::"improved"} I have reviewed the patients home medicines and have made adjustments  as needed   Social Determinants of Health:  ***   Test / Admission - Considered:  ***   {Document critical care time when appropriate:1} {Document review of labs and clinical decision tools ie heart score, Chads2Vasc2 etc:1}  {Document your independent review of radiology images, and any outside records:1} {Document your discussion with family members, caretakers, and with consultants:1} {Document social determinants of health affecting pt's care:1} {Document your decision making why or why not admission, treatments were needed:1} Final Clinical Impression(s) / ED Diagnoses Final diagnoses:  None    Rx / DC Orders ED Discharge Orders     None

## 2022-10-31 ENCOUNTER — Encounter (HOSPITAL_COMMUNITY): Payer: Self-pay

## 2022-10-31 ENCOUNTER — Other Ambulatory Visit: Payer: Self-pay

## 2022-10-31 MED ORDER — MELOXICAM 15 MG PO TABS: 15.0000 mg | ORAL_TABLET | Freq: Every day | ORAL | 0 refills | Status: AC

## 2022-10-31 MED ORDER — PREDNISONE 10 MG (21) PO TBPK: ORAL_TABLET | Freq: Every day | ORAL | 0 refills | Status: DC

## 2022-10-31 NOTE — Discharge Instructions (Signed)
There were prescriptions sent to your pharmacy: -Take prednisone taper as prescribed. -Take meloxicam daily in place of other NSAID medications  Continue your narcotic pain medication as needed.  Follow-up with your surgeon as scheduled.  Return to the emergency department for any new or worsening symptoms of concern.

## 2023-01-25 LAB — COLOGUARD: COLOGUARD: NEGATIVE

## 2023-04-11 NOTE — Telephone Encounter (Signed)
 Patient is requesting the following refill, routed to provider for confirmation.  Requested Prescriptions   Pending Prescriptions Disp Refills  . metoPROLOL succinate (TOPROL-XL) 25 MG 24 hr tablet [Pharmacy Med Name: METOPROLOL SUCC ER 25 MG TAB] 90 tablet 1    Sig: Take 1 tablet (25 mg total) by mouth daily.    Recent Visits Date Type Provider Dept  11/30/22 Office Visit Chad Lynwood Buster, MD [facility]  05/31/22 Office Visit Chad Lynwood Buster, MD [facility]  Showing recent visits within past 365 days and meeting all other requirements Future Appointments No visits were found meeting these conditions. Showing future appointments within next 365 days and meeting all other requirements    Labs: Vitals:  BP Readings from Last 3 Encounters:  11/30/22 130/89  05/31/22 124/82  12/15/21 128/86   and  Pulse Readings from Last 3 Encounters:  11/30/22 80  05/31/22 82  12/15/21 77

## 2023-06-07 NOTE — Progress Notes (Signed)
 Patient identified by 2 identifiers. Allergies verified.  IM injection administered, per order, in Left deltoid.  Patient tolerated well.  VIS provided.

## 2023-06-07 NOTE — Progress Notes (Signed)
 [employer] for [employer] Complaint  Patient presents with  . Annual Exam    Would like Dr. Chad to prescribe Epi-pen and Breo.      Patient Care Team: Chad Lynwood Buster, MD as PCP - General (Internal Medicine) Chad Lynwood Buster, MD as PCP - Deberah Shin, Brian LABOR, MD (Allergy and Immunology)  Rollo Jungling, [place], [place] (GYN and hormone management) Neurosurgeon, Reyes Budge   Assessment:   S/p C5-7 laminectomy and fusion 12/24. UE paresthesias, weakness and headaches have resolved. Her neurosurgeon has her taking gabapentin and flexeril at night -both help with muscle tightness in the neck but the Flexeril gives her a headache so she is largely stopped that.  I think she did try tizanidine in the past and suggested she could try that.  Methocarbamol is not helpful. Allergy/RAD management - her allergist at [facility] passed away so needs me to refill epi pen (shellfish allergy) and Breo. No wheezing issues  Hormone management per a integrative medicine provider in [place]. Does want to check levels Weight management. Having success for the first time since menopause on tirzepatide.  Has lost 30 pounds. Has maintained muscle mass (up 2# actually) since November. Originonally wt loss was about half muscle, however, she is now being really good with protein intake. Unfortunately, she cannot lift weights or go to gym while neck heals (per her neurosurgeon;s orders) until September.   HTN under control.   Metoprolol 25 mg daily (also for palpitations, ow would use ARB or diuretic) COntinue good diet and whatever aerobic exercise she is permitted to do. In Sep she will inc activity (walks - inc pace and use elliptical once restrictions lifted) Dry eyes and dry mouth.  No history of corneal ulcers but she is describing something that sounds like it could be Raynaud's phenomenon.  Will check Sjogren's antibodies.  Also advised her to ask  her ophthalmologist to do a Schirmer's test. Elev liver enzymes most likely NASH, resolved with wt loss, less (very littel now) ETOH Insomnia. Long term problem with sleep maintenance Practices good sleep hygiene.  Rare glass of wine Caffeine: only 1 cup coffee in AM Read to sleep, avoid TV (pretty good about that already) See if Am dosing of beta blocker helps Trial of magnesium 400-800 mg qHS  Might consider trazodone or stopping metoprolol (in favor of other BP med, though palpitations were an issue in past).  Sent prescription for trazodone, 25-50 mg of magnesium is not helpful.  Health Maintenance Summary w/Most Recent Date          Current Care Gaps     Pneumococcal Vaccine 50+ (1 of 1 - PCV) Never done   No completion history exists for this topic.              Awaiting Completion     Mammogram (Yearly) Order placed this encounter    06/07/2023  Order placed for [facility] Digital Screening W [place] Bilateral by Chad Lynwood Buster, MD   05/31/2022  Mammography screening bilateral   05/09/2021  Mammo Digital Screening Bilateral   11/17/2019  Mammography diagnostic left   03/10/2019  Mammo Digital Screening Bilateral   Only the first 5 history entries have been loaded, but more history exists.             Upcoming     Colon Cancer Screening (FIT-DNA Stool Test - Every 3 Years) Next due on 01/17/2026    01/18/2023  Colorectal Cancer DNA + FIT  component of Colorectal Cancer DNA + FIT   09/24/2017  HM FIT-DNA STOOL TEST   01/10/2017  HM FIT-DNA STOOL TEST   01/10/2017  HM FIT-DNA STOOL TEST   12/27/2016  HM FIT-DNA STOOL TEST    Only the first 5 history entries have been loaded, but more history exists.         Lipid Screening (Every 5 Years) Next due on 05/13/2026    06/07/2023  Order placed for Lipid Panel by Chad Lynwood Buster, MD   05/12/2021  Smart[facility]: [facility] TOTAL CHOLESTEROL AND HDL COMPLETE   05/11/2021 * Lipid Panel   03/01/2020 * Lipid Panel    03/10/2019 * Lipid Panel    Only the first 5 history entries have been loaded, but more history exists.         DTaP/Tdap/Td Vaccines (4 - Td or Tdap) Next due on 07/29/2032    07/30/2022  Imm Admin: TdaP   03/01/2020  Imm Admin: TdaP   02/14/2010  Imm Admin: TdaP              Completed or No Longer Recommended     Hepatitis C Screen  Completed    05/30/2022  Hepatitis C Antibody   03/10/2019  Hepatitis C Ab component of Hepatitis C Antibody         COVID-19 Vaccine (Series Information) Completed    09/24/2022  Imm Admin: Covid-19 Vac, (75yr+) (Comirnaty) Mrna Pfizer    07/30/2022  Imm Admin: Covid-19 Vac, (69yr+) (Spikevax) Monovalent Moderna   10/30/2020  Imm Admin: COVID-19 VAC,BIVALENT(1YR UP),PFIZER   07/07/2020  Imm Admin: COVID-19 VACCINE,MRNA(MODERNA)(PF)   07/07/2020  Imm Admin: COVID-19 VACC,MRNA,(PFIZER)(PF)    Only the first 5 history entries have been loaded, but more history exists.         Influenza Vaccine (Series Information) Completed    09/24/2022  Imm Admin: INFLUENZA MDCK PF,IIV3(6 MOS UP)(EGG FREE)(FLUCELVAX)   11/14/2021  Imm Admin: INFLUENZA INJ MDCK PF, QUAD,(FLUCELVAX)(2MO AND UP EGG FREE)   11/14/2021  Imm Admin: Influenza Virus Vaccine, unspecified formulation   10/30/2020  Imm Admin: Influenza Virus Vaccine, unspecified formulation   10/01/2019  Imm Admin: Influenza Virus Vaccine, unspecified formulation    Only the first 5 history entries have been loaded, but more history exists.         Zoster Vaccines (Series Information) Completed    03/01/2020  Imm Admin: SHINGRIX-ZOSTER VACCINE (HZV),RECOMBINANT,ADJUVANTED(IM)   03/10/2019  Imm Admin: SHINGRIX-ZOSTER VACCINE (HZV),RECOMBINANT,ADJUVANTED(IM)                 Total hysterectomy with BSO 2013.  Pap no longer indicated.  Cologuard negative 12/18-would like to go ahead with colonoscopy.  Ordered  Mammogram today Results for orders placed during the hospital  encounter of 09/27/15  Mammo Screening Bilateral     Immunization History  Administered Date(s) Administered  . COVID-19 VAC,BIVALENT(1YR UP),PFIZER 10/30/2020  . COVID-19 VACC,MRNA,(PFIZER)(PF) 03/22/2019, 04/12/2019, 11/13/2019, 07/07/2020  . COVID-19 VACCINE,MRNA(MODERNA)(PF) 06/16/2020, 07/07/2020  . Covid-19 Vac, (60yr+) (Comirnaty) Mrna Pfizer  09/24/2022  . Covid-19 Vac, (63yr+) (Spikevax) Monovalent Moderna 07/30/2022  . INFLUENZA INJ MDCK PF, QUAD,(FLUCELVAX)(2MO AND UP EGG FREE) 11/14/2021  . INFLUENZA MDCK PF,IIV3(6 MOS UP)(EGG FREE)(FLUCELVAX) 09/24/2022  . INFLUENZA TIV (TRI) PF (IM)(HISTORICAL) 02/14/2010, 01/31/2011  . Influenza Vaccine Quad(IM)6 MO-Adult(PF) 10/16/2017, 09/10/2018  . Influenza Virus Vaccine, unspecified formulation 09/16/2013, 10/01/2019, 10/30/2020, 11/14/2021  . SHINGRIX-ZOSTER VACCINE (HZV),RECOMBINANT,ADJUVANTED(IM) 03/10/2019, 03/01/2020  . TdaP 02/14/2010, 03/01/2020, 07/30/2022   Health Maintenance Due  Topic Date Due  .  Pneumococcal Vaccine 50+ (1 of 1 - PCV) Never done  . Mammogram  05/31/2023   Patient response, barriers, and adherence to medication have been identified and addressed. Barriers to the treatment plan have been identified and addressed with patient.  Unhealthy behaviors have been noted and addressed as appropriate. Patient voiced understanding and all questions have been answered to satisfaction.   Discussed age appropriate preventive counseling  Since they travel, recommended Hep A vaccine#1 today   Subjective:    Jaynie Knoche is a 58 y.o. year old female here for her annual visit.   Weight management: Has lost > 30 pounds since going on Mounjaro (indication - high LDL, fatty liver and FH CAD).  It suppresses her appetite and also desire for alcohol. She has been limited in maintaining muscle mass bc cannot life weights but has been good about protein intake and is walking on a regular basis.  Once she gets to September  she will be free to start doing some weightlifting and perhaps get back to [facility].  She had a cervical discectomy and disc replacement at C5-7 last October and it has largely relieved the paresthesias and what ever weakness she was experiencing in her arms.  Also has less neck pain.  She does feel tightness in the neck if she does not take her gabapentin at night which is prescribed by Dr. Mavis, her neurosurgeon, and he also has her taking Flexeril but she stopped that because she wakes up with headaches.  She has tried Robaxin which she took in the past for neck tightness and although it helped in the past it has not really been helpful in this situation.  Overall she is quite happy with the results, however.  She is not allowed to lift more than 3 pounds until September, however.  Only new complaint on review of systems is that at times her fingertips will turn yellow and tingle and she can also feel that sometimes in her feet although not necessarily the toes.  She noticed it more in the winter but it can also happen in warm weather and is very random in its occurrence and does not seem to be related necessarily to exercise or rest or her neck position.  Goes away quickly.  She drinks a lot of water because she was has a dry mouth and on review of systems also has dry eyes although never any corneal ulcerations.  She was told by prior ophthalmologist she could not use contacts because of dry eyes but her current ophthalmologist says that she can.  She has not had any objective testing for dry eyes.  Her allergist retired so she needs somebody to prescribe her epinephrine pens and also Breo.  Has not had any trouble of late with asthma or wheezing.  She continues to see an integrative specialist for hormone replacement.  She is status post TAH/BSO years ago.  Patient Active Problem List  Diagnosis  . Allergic rhinitis  . Hormone replacement therapy  . Hypothyroidism due to acquired atrophy  of thyroid  . Executive wellness examination  . Cervical spondylosis  . Mixed hyperlipidemia  . Family history of breast cancer  . Left axillary pain  . Abdominal bloating  . B12 deficiency  . Encounter for orthopedic follow-up care  . Allergic rhinitis due to pollen  . Primary hypertension  . Adjustment disorder with anxious mood  . Hearing loss of left ear  . NASH (nonalcoholic steatohepatitis)  . Exercise-induced bronchospasm (HHS-HCC)  Current Outpatient Medications  Medication Sig Dispense Refill  . fexofenadine (ALLEGRA) 180 MG tablet Take 1 tablet (180 mg total) by mouth daily.    . L. acidophilus-L. rhamnosus (PROBIOTIC) 15 billion cell cap Take by mouth two (2) times a day. Metagenics Ultraflora Balance     . levocetirizine (XYZAL) 5 MG tablet Take 1 tablet (5 mg total) by mouth every evening.    . metoPROLOL succinate (TOPROL-XL) 25 MG 24 hr tablet Take 1 tablet (25 mg total) by mouth daily. 90 tablet 1  . multivit,tx with iron,minerals (COMPLETE MULTIVITAMIN ORAL) Take by mouth. Takes Two Vitafusion Multivites    . PROGESTERONE MICRONIZED ORAL Place 50 mg under the tongue.    . tirzepatide (MOUNJARO) 7.5 mg/0.5 mL PnIj Inject 7.5 mg under the skin once a week. 2 mL 5  . albuterol sulfate (PROAIR RESPICLICK) 90 mcg/actuation AePB 2 inhalations as needed    . EPINEPHrine (EPIPEN) 0.3 mg/0.3 mL injection Inject 0.3 mL (0.3 mg total) under the skin once for 1 dose. 2 each 1  . estradiol (ESTRACE) 2 MG tablet Take 1 tablet (2 mg total) by mouth daily.    . fluticasone furoate-vilanterol (BREO ELLIPTA) 200-25 mcg/dose inhaler Inhale 1 puff daily. 180 each 3  . gabapentin (NEURONTIN) 300 MG capsule Take 1 capsule (300 mg total) by mouth two (2) times a day.    . magnesium oxide (MAG-OX) 400 mg (241.3 mg elemental magnesium) tablet Take 1-2 tablets (400-800 mg total) by mouth daily. 200 tablet 1   No current facility-administered medications for this visit.  She also takes some  supplements and was getting [facility] at [facility] and felt much better, but then they got a new nurse who could not get venous access. Was getting this weekly and then every 3-4 weeks.    The patient verbalizes understanding of medication regimen. There are no barriers to adherence with this regimen.   Past Medical History:  Diagnosis Date  . Allergic   . Asthma (HHS-HCC)   . Degenerative disc disease, cervical   . Disease of thyroid gland    Past Surgical History:  Procedure Laterality Date  . AUGMENTATION MAMMAPLASTY Bilateral    Removed  . COSMETIC SURGERY  2012   Breast lift and abdominoplasty  . HYSTERECTOMY     Bilateral salpingo-oophorectomy for endometriosis  . OOPHORECTOMY    . TONSILLECTOMY Bilateral   . ULNAR NERVE REPAIR Right 02/10/2019   Ulnar nerve cubical release    Family History  Problem Relation Age of Onset  . Arthritis Mother   . Osteoporosis Mother   . Alzheimer's disease Maternal Grandmother   . Depression Maternal Grandmother   . Stroke Maternal Grandmother   . Heart disease Maternal Grandmother   . Mental illness Maternal Grandmother   . Breast cancer Maternal Grandmother 14  . Diabetes Maternal Grandmother   . Cancer Father        Bladder and Prostate cancer  . Heart disease Maternal Grandfather        CHF  . Diabetes Paternal Grandfather   . Alzheimer's disease Paternal Grandfather   . Breast cancer Maternal Aunt        Great Aunt- diagnosed in her 41s  . Breast cancer Other        in 60's  . Breast cancer Other        on 60's  . Diabetes Paternal Grandmother   . No Known Problems Sister   . No Known  Problems Daughter   . Colon cancer Neg Hx   . Endometrial cancer Neg Hx   . Ovarian cancer Neg Hx   . Melanoma Neg Hx   . Basal cell carcinoma Neg Hx   . Squamous cell carcinoma Neg Hx   . BRCA 1/2 Neg Hx    Her maternal grandmother, maternal great aunt, and two maternal great grandmothers all have breast  cancer onset at age 10. No known ovarian cancer but her mother had a hysterectomy at age 16 due to fibroids and one of the great grandmothers also had a hysterectomy at a young age for unclear reasons.  Social History   Socioeconomic History  . Marital status: Married    Spouse name: Rolan  . Number of children: 3  Occupational History  . Occupation: CFO  Tobacco Use  . Smoking status: Never  . Smokeless tobacco: Never  Substance and Sexual Activity  . Alcohol use: Yes    Alcohol/week: 8.0 standard drinks of alcohol    Types: 8 Glasses of wine per week  . Drug use: No  . Sexual activity: Yes    Partners: Male  Other Topics Concern  . Do you use sunscreen? Yes  . Tanning bed use? Yes  . Are you easily burned? Yes  . Excessive sun exposure? No  . Blistering sunburns? 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  She is married to [place] and has 3 sons.  Born in [place], [place]  and lives in [place].    reports current alcohol use of about 8.0 standard drinks of alcohol per week.  There have been several family stressors.  Their granddaughter, Elberta, who is the daughter of their son who lives in [place], is now at an art boarding school and doing well. Much less stress. For a while Elberta was living with her biological mom but ended up witnessing some type of drug problem so now is back with the family. They have another son who is at [employer] state in the business school completing his degree online, had failed out after 2-1/2.  He is interested in renewable green energy and wife is Landscape architect in this at [place] so live in [place].  Another son is at [employer] in [place]  in creative writing magna cum laude, graduates in 2023. He has a history of dyspraxia which created problems with hand coordination and math, but he does quite well on a keyboard. He will be getting his Masters at [employer]. Their business is growing causing some stress  but it is a good thing.   Review of Systems: 10 point review of systems was negative except has mentioned in HPI.  Epworth Sleepiness Scale:    PHQ-2 Score: PHQ-9 Score:    GAD7 Total Score GAD-7 Total Score  06/07/2023  8:30 AM 12  05/31/2022  2:00 PM 10  05/09/2021  8:00 AM 12     Objective: BP 123/82 (BP Site: R Arm, BP Position: Sitting)   Pulse 65   Temp 36.4 C (97.5 F) (Oral)   SpO2 95%  Physical Examination: General appearance - alert, well appearing, and in no distress Mental status - alert, oriented to person, place, and time Eyes - pupils equal and reactive, extraocular eye movements intact.  Very subtle fasciculations of the right upper eyelid intermittently through the visit. Ears - bilateral TM's and external ear canals normal Mouth - mucous membranes moist, pharynx normal without lesions Neck -  supple, no significant adenopathy Chest - clear to auscultation, no wheezes, rales or rhonchi, symmetric air entry Heart - normal rate, regular rhythm, normal S1, S2, no murmurs, rubs, clicks or gallops Abdomen - soft, nontender, NT.  Liver and spleen not palpable.    Breasts -performed by Dr. Onita Pelvic - examination not indicated Neurological - alert, oriented, normal speech, no focal findings or movement disorder noted.  DTRs 2+ at the ankles and 1+ at the knees bilaterally.  2+ in the upper extremities with positive Hoffmann sign.      *Some images could not be shown.

## 2023-06-07 NOTE — Progress Notes (Signed)
 Patient presents today for nurse visit before their yearly physical scheduled for 5/22 .  Patient identified using 2 patient identifiers.Difficult stick, 3 attempts to draw blood by 2 nurse unsuccessful.  Urine specimen collected, per order.  Hearing screen Not Performed using audiometry machine.  Last hearing exam was 5/24.  Vision screening Not Performed using the Titmus V4 Vision Screener.  Last vision screening was 5/25.   Biometrics measured Jun 07, 2023 8:44 AM   Biometrics Height: 161.3 cm (5' 3.5) Weight: 55.4 kg (122 lb 3.2 oz) BMI (Calculated): 21.3 Waist Circumference: 27 inches Hip Circumference: 36 inches Waist to Hip Ratio: 0.75 Body Fat %: 29.6 % Body Fat Mass: 36.2 lbs Body Fat Range: Healthy Fat Free Mass: 86 lbs Visceral Fat Rating: 6 Body Water %: 48.2 % Body Water Mass: 59.6 lbs Muscle Mass: 81.6 lbs Muscle Mass Score: Average Bone Mass: 4.4 lbs Basal Metabolic Rate: 1167 kcal Metabolic Age: 71 Years}

## 2023-06-07 NOTE — Progress Notes (Signed)
 Vital Signs & Body Composition            Sophia Blair          MR# [id number]       DOB:1965-04-12  DATE: 11/24/2008 02/14/2010 01/31/2011 11/25/2013 09/27/2015 09/24/2017 03/10/2019 03/01/2020 05/09/2021 11/30/2022 06/07/2023   [age]:  58 45 46 48 50 52 54 55 56 57  58   HEIGHT: 5' 4.5 5 4 5 4  5' 3.5 5' 3.5 5' 3.5 5' 3.5 5' 3.5 5' 3.5 5' 3.5 22ft3.5in   Wt 129.3 lbs. 138 lbs. 132.9 lbs. 155.4 lbs. 153.5 lbs. 146.4 lbs 157.9 lbs 158.0 ls 157.8 122 122.2lb   BMI 21.85 23.69 22.81 27.09 27.19 25.53 27.53 27.5 27.5 21.3 21.3 Normal 18.5-24.9                               RESULTS:           Rating   Waist circum 29 29 27 31  32 34'' 33'' 33'' dec 30 27 Waist circumference (WC) is an indicator of health risk associated with excess fat around the waist. WC of 35 inches or more in women is associated with health problems such as type 2 diabetes, heart disease and high blood pressure  Hip circum 38 41 39 42 44 44'' 43'' 43'' dec 39 36   W:H ratio 0.76 0.71 0.79 0.74 0.73 0.77 0.77 0.77 dec 0.77 0.75 Goal is 0.80 or below.  A ratio > .80 puts you at increased risk of coronary disease, diabetes, and stroke.  Fat 30.7% 34.9% 31.4% 38.0% 37.3% 36.4% 38.2% 35.5% 36.6% 33.4% 29.6% Age 56-39        21-33%                                                                                          Age 60-59       23-34%                                                                                        Age 89-99       24-36%  BMR/Met age 13 / 102 71 / 55 1247 / 20 1333 / 32 1327 / 52 1283/53 1344/60 1388/52 1366/56 1167 1167/43 Metabolic rate mainly estimates the speed at which your body burns calories to supply energy  Visceral 4 6 5 7 7 7 8 8 8 6 6  1-12 healthy  (fat stored in abdominal cavity)                B/P L 100/66   R 111/75 L 109/76      R 101/69 L119/80       R 117/80 L 131/87 R 128/84 L 116/79 R 120/84 L  131/88        R  125/84 L 132/89                             R 127/87 L 126/85                              R 124/80 L 149/94 R124/82 R 123/82 Goal: <130/85   Heart rate 75 64 73 75 72 62 79 75 72 82  65

## 2023-08-31 NOTE — Telephone Encounter (Signed)
 Called to let the patient know Dr.Kurz ordered required bloodwork/EKG for upcoming surgery. Patient needs to schedule a nurse visit. Surgery date is

## 2023-10-08 NOTE — Telephone Encounter (Signed)
 Patient is requesting the following refill Requested Prescriptions   Pending Prescriptions Disp Refills  . metoPROLOL succinate (TOPROL-XL) 25 MG 24 hr tablet [Pharmacy Med Name: METOPROLOL SUCC ER 25 MG TAB] 90 tablet 2    Sig: TAKE 1 TABLET (25 MG TOTAL) BY MOUTH DAILY.    Recent Visits Date Type Provider Dept  06/07/23 Office Visit Chad Lynwood Buster, MD [facility] [address] [place]  88/85/75 Office Visit Chad Lynwood Buster, MD [facility]  Showing recent visits within past 365 days and meeting all other requirements Future Appointments Date Type Provider Dept  11/26/23 Appointment Chad Lynwood Buster, MD [facility] [address] [place]  Showing future appointments within next 365 days and meeting all other requirements    Labs: Creatinine:  Creatinine (mg/dL)  Date Value  90/95/7974 0.79   Potassium:  Potassium (mmol/L)  Date Value  09/20/2023 5.5 (H)   Sodium:  Sodium (mmol/L)  Date Value  09/20/2023 140   Vitals:  BP Readings from Last 3 Encounters:  06/07/23 123/82  11/30/22 130/89  05/31/22 124/82   and  Pulse Readings from Last 3 Encounters:  06/07/23 65  11/30/22 80  05/31/22 82

## 2023-10-10 ENCOUNTER — Encounter (HOSPITAL_BASED_OUTPATIENT_CLINIC_OR_DEPARTMENT_OTHER): Payer: Self-pay | Admitting: Orthopedic Surgery

## 2023-10-10 ENCOUNTER — Other Ambulatory Visit: Payer: Self-pay

## 2023-10-17 ENCOUNTER — Encounter (HOSPITAL_BASED_OUTPATIENT_CLINIC_OR_DEPARTMENT_OTHER)
Admission: RE | Disposition: A | Discharge status: Home / Self Care | Payer: Self-pay | Source: Home / Self Care | Attending: Orthopedic Surgery

## 2023-10-17 ENCOUNTER — Ambulatory Visit (HOSPITAL_BASED_OUTPATIENT_CLINIC_OR_DEPARTMENT_OTHER)
Admission: RE | Admit: 2023-10-17 | Discharge: 2023-10-17 | Disposition: A | Discharge status: Home / Self Care | Attending: Orthopedic Surgery | Admitting: Orthopedic Surgery

## 2023-10-17 ENCOUNTER — Encounter (HOSPITAL_BASED_OUTPATIENT_CLINIC_OR_DEPARTMENT_OTHER): Payer: Self-pay | Admitting: Orthopedic Surgery

## 2023-10-17 ENCOUNTER — Ambulatory Visit (HOSPITAL_BASED_OUTPATIENT_CLINIC_OR_DEPARTMENT_OTHER): Admitting: Anesthesiology

## 2023-10-17 DIAGNOSIS — Z01818 Encounter for other preprocedural examination: Secondary | ICD-10-CM

## 2023-10-17 DIAGNOSIS — J45909 Unspecified asthma, uncomplicated: Secondary | ICD-10-CM | POA: Insufficient documentation

## 2023-10-17 DIAGNOSIS — G5602 Carpal tunnel syndrome, left upper limb: Secondary | ICD-10-CM | POA: Insufficient documentation

## 2023-10-17 HISTORY — DX: Other specified postprocedural states: R11.2

## 2023-10-17 HISTORY — DX: Anxiety disorder, unspecified: F41.9

## 2023-10-17 HISTORY — DX: Other complications of anesthesia, initial encounter: T88.59XA

## 2023-10-17 HISTORY — PX: CARPAL TUNNEL RELEASE: SHX101

## 2023-10-17 SURGERY — CARPAL TUNNEL RELEASE
Anesthesia: Monitor Anesthesia Care | Site: Hand | Laterality: Left

## 2023-10-17 MED ORDER — SUCCINYLCHOLINE CHLORIDE 200 MG/10ML IV SOSY
PREFILLED_SYRINGE | INTRAVENOUS | Status: AC
  Filled 2023-10-17: qty 10

## 2023-10-17 MED ORDER — OXYCODONE HCL 5 MG PO TABS
ORAL_TABLET | ORAL | Status: AC
  Filled 2023-10-17: qty 1

## 2023-10-17 MED ORDER — LIDOCAINE HCL (CARDIAC) PF 100 MG/5ML IV SOSY
PREFILLED_SYRINGE | INTRAVENOUS | Status: DC | PRN
  Administered 2023-10-17: 10 mg via INTRAVENOUS

## 2023-10-17 MED ORDER — BUPIVACAINE HCL (PF) 0.25 % IJ SOLN
INTRAMUSCULAR | Status: AC
  Filled 2023-10-17: qty 120

## 2023-10-17 MED ORDER — 0.9 % SODIUM CHLORIDE (POUR BTL) OPTIME
TOPICAL | Status: DC | PRN
  Administered 2023-10-17: 1000 mL

## 2023-10-17 MED ORDER — OXYCODONE HCL 5 MG/5ML PO SOLN: 5.0000 mg | Freq: Once | ORAL | Status: AC | PRN

## 2023-10-17 MED ORDER — MIDAZOLAM HCL 5 MG/5ML IJ SOLN
INTRAMUSCULAR | Status: DC | PRN
  Administered 2023-10-17: 2 mg via INTRAVENOUS

## 2023-10-17 MED ORDER — LIDOCAINE 2% (20 MG/ML) 5 ML SYRINGE
INTRAMUSCULAR | Status: AC
  Filled 2023-10-17: qty 5

## 2023-10-17 MED ORDER — ACETAMINOPHEN 10 MG/ML IV SOLN: 1000.0000 mg | Freq: Once | INTRAVENOUS | Status: DC | PRN

## 2023-10-17 MED ORDER — LACTATED RINGERS IV SOLN: INTRAVENOUS | Status: DC

## 2023-10-17 MED ORDER — CEFAZOLIN SODIUM-DEXTROSE 2-4 GM/100ML-% IV SOLN
2.0000 g | INTRAVENOUS | Status: AC
  Administered 2023-10-17: 2 g via INTRAVENOUS

## 2023-10-17 MED ORDER — ONDANSETRON HCL 4 MG/2ML IJ SOLN
INTRAMUSCULAR | Status: AC
  Filled 2023-10-17: qty 2

## 2023-10-17 MED ORDER — FENTANYL CITRATE (PF) 100 MCG/2ML IJ SOLN
INTRAMUSCULAR | Status: DC | PRN
  Administered 2023-10-17: 100 ug via INTRAVENOUS

## 2023-10-17 MED ORDER — OXYCODONE HCL 5 MG PO TABS
5.0000 mg | ORAL_TABLET | Freq: Once | ORAL | Status: AC | PRN
  Administered 2023-10-17: 5 mg via ORAL

## 2023-10-17 MED ORDER — ONDANSETRON HCL 4 MG/2ML IJ SOLN
INTRAMUSCULAR | Status: DC | PRN
  Administered 2023-10-17: 4 mg via INTRAVENOUS

## 2023-10-17 MED ORDER — ATROPINE SULFATE 0.4 MG/ML IV SOLN
INTRAVENOUS | Status: AC
  Filled 2023-10-17: qty 1

## 2023-10-17 MED ORDER — BUPIVACAINE HCL (PF) 0.25 % IJ SOLN
INTRAMUSCULAR | Status: DC | PRN
  Administered 2023-10-17: 10 mL

## 2023-10-17 MED ORDER — MIDAZOLAM HCL 2 MG/2ML IJ SOLN
INTRAMUSCULAR | Status: AC
  Filled 2023-10-17: qty 2

## 2023-10-17 MED ORDER — EPHEDRINE 5 MG/ML INJ
INTRAVENOUS | Status: AC
  Filled 2023-10-17: qty 5

## 2023-10-17 MED ORDER — DROPERIDOL 2.5 MG/ML IJ SOLN: 0.6250 mg | Freq: Once | INTRAMUSCULAR | Status: DC | PRN

## 2023-10-17 MED ORDER — PROPOFOL 500 MG/50ML IV EMUL
INTRAVENOUS | Status: AC
  Filled 2023-10-17: qty 50

## 2023-10-17 MED ORDER — FENTANYL CITRATE (PF) 100 MCG/2ML IJ SOLN: 25.0000 ug | INTRAMUSCULAR | Status: DC | PRN

## 2023-10-17 MED ORDER — FENTANYL CITRATE (PF) 100 MCG/2ML IJ SOLN
INTRAMUSCULAR | Status: AC
  Filled 2023-10-17: qty 2

## 2023-10-17 MED ORDER — PROPOFOL 10 MG/ML IV BOLUS
INTRAVENOUS | Status: DC | PRN
Start: 2023-10-17 — End: 2023-10-17
  Administered 2023-10-17: 20 mg via INTRAVENOUS

## 2023-10-17 MED ORDER — PHENYLEPHRINE 80 MCG/ML (10ML) SYRINGE FOR IV PUSH (FOR BLOOD PRESSURE SUPPORT)
PREFILLED_SYRINGE | INTRAVENOUS | Status: AC
  Filled 2023-10-17: qty 10

## 2023-10-17 MED ORDER — CEFAZOLIN SODIUM-DEXTROSE 2-4 GM/100ML-% IV SOLN
INTRAVENOUS | Status: AC
  Filled 2023-10-17: qty 100

## 2023-10-17 MED ORDER — ACETAMINOPHEN 500 MG PO TABS: 1000.0000 mg | ORAL_TABLET | Freq: Once | ORAL | Status: DC

## 2023-10-17 MED ORDER — PROPOFOL 500 MG/50ML IV EMUL
INTRAVENOUS | Status: DC | PRN
  Administered 2023-10-17: 100 ug/kg/min via INTRAVENOUS

## 2023-10-17 MED ORDER — OXYCODONE HCL 5 MG PO TABS: 5.0000 mg | ORAL_TABLET | Freq: Four times a day (QID) | ORAL | 0 refills | Status: AC | PRN

## 2023-10-17 SURGICAL SUPPLY — 25 items
BLADE SURG 15 STRL LF DISP TIS (BLADE) ×1 IMPLANT
BNDG COMPR ESMARK 4X3 LF (GAUZE/BANDAGES/DRESSINGS) ×1 IMPLANT
BNDG ELASTIC 3INX 5YD STR LF (GAUZE/BANDAGES/DRESSINGS) ×1 IMPLANT
BNDG GAUZE DERMACEA FLUFF 4 (GAUZE/BANDAGES/DRESSINGS) ×1 IMPLANT
CHLORAPREP W/TINT 26 (MISCELLANEOUS) ×1 IMPLANT
CORD BIPOLAR FORCEPS 12FT (ELECTRODE) ×1 IMPLANT
COVER BACK TABLE 60X90IN (DRAPES) ×1 IMPLANT
CUFF TOURN SGL QUICK 18X4 (TOURNIQUET CUFF) ×1 IMPLANT
DRAPE EXTREMITY T 121X128X90 (DISPOSABLE) ×1 IMPLANT
DRAPE SURG 17X23 STRL (DRAPES) ×1 IMPLANT
GAUZE XEROFORM 1X8 LF (GAUZE/BANDAGES/DRESSINGS) ×1 IMPLANT
GLOVE BIO SURGEON STRL SZ7 (GLOVE) ×1 IMPLANT
GLOVE BIOGEL PI IND STRL 7.0 (GLOVE) ×1 IMPLANT
GOWN STRL REUS W/ TWL LRG LVL3 (GOWN DISPOSABLE) ×2 IMPLANT
NDL HYPO 25X1 1.5 SAFETY (NEEDLE) ×1 IMPLANT
NEEDLE HYPO 25X1 1.5 SAFETY (NEEDLE) ×1 IMPLANT
NS IRRIG 1000ML POUR BTL (IV SOLUTION) ×1 IMPLANT
PACK BASIN DAY SURGERY FS (CUSTOM PROCEDURE TRAY) ×1 IMPLANT
SHEET MEDIUM DRAPE 40X70 STRL (DRAPES) ×1 IMPLANT
SUT ETHILON 4 0 PS 2 18 (SUTURE) ×1 IMPLANT
SUT VICRYL RAPIDE 4/0 PS 2 (SUTURE) IMPLANT
SYR BULB EAR ULCER 3OZ GRN STR (SYRINGE) ×1 IMPLANT
SYR CONTROL 10ML LL (SYRINGE) ×1 IMPLANT
TOWEL GREEN STERILE FF (TOWEL DISPOSABLE) ×2 IMPLANT
UNDERPAD 30X36 HEAVY ABSORB (UNDERPADS AND DIAPERS) ×1 IMPLANT

## 2023-10-17 NOTE — Interval H&P Note (Signed)
 History and Physical Interval Note:  10/17/2023 8:22 AM  Sophia Blair  has presented today for surgery, with the diagnosis of Left carpal tunnel syndrome.  The various methods of treatment have been discussed with the patient and family. After consideration of risks, benefits and other options for treatment, the patient has consented to  Procedure(s): CARPAL TUNNEL RELEASE (Left) as a surgical intervention.  The patient's history has been reviewed, patient examined, no change in status, stable for surgery.  I have reviewed the patient's chart and labs.  Questions were answered to the patient's satisfaction.     Baylyn Sickles

## 2023-10-17 NOTE — H&P (Signed)
 HAND SURGERY   HPI: Patient is a 58 y.o. female who presents with left carpal tunnel syndrome that has failed conservative management.  Patient denies any changes to their medical history or new systemic symptoms today.    Past Medical History:  Diagnosis Date   Anxiety    Asthma    Complication of anesthesia    Endometriosis    Environmental allergies    Hormone replacement therapy    Multiple food allergies    Tomato, shellfish   PONV (postoperative nausea and vomiting)    Past Surgical History:  Procedure Laterality Date   ABDOMINAL HYSTERECTOMY     CERVICAL DISCECTOMY     DILATION AND CURETTAGE OF UTERUS     LAPAROSCOPY     TONSILLECTOMY     Social History   Socioeconomic History   Marital status: Married    Spouse name: Not on file   Number of children: Not on file   Years of education: Not on file   Highest education level: Not on file  Occupational History   Not on file  Tobacco Use   Smoking status: Never   Smokeless tobacco: Never  Substance and Sexual Activity   Alcohol use: Yes    Alcohol/week: 2.0 standard drinks of alcohol    Types: 2 Glasses of wine per week    Comment: 2 glasses /night   Drug use: No   Sexual activity: Yes    Birth control/protection: Surgical    Comment: hyst  Other Topics Concern   Not on file  Social History Narrative   Not on file   Social Drivers of Health   Financial Resource Strain: Not on file  Food Insecurity: Not on file  Transportation Needs: Not on file  Physical Activity: Not on file  Stress: Not on file  Social Connections: Not on file   History reviewed. No pertinent family history. - negative except otherwise stated in the family history section Allergies  Allergen Reactions   Shellfish Allergy Hives   Tomato Hives   Dust Mite Extract Other (See Comments)   Sulfa Antibiotics    Prior to Admission medications   Medication Sig Start Date End Date Taking? Authorizing Provider  Ascorbic Acid  (VITAMIN C PO) Take 1 tablet by mouth daily.   Yes [provider]  Calcium Carbonate-Vitamin D 600-400 MG-UNIT tablet Take by mouth. 11/24/08  Yes [provider]  Cyanocobalamin (VITAMIN B-12 PO) Take 1 tablet by mouth daily.   Yes [provider]  estradiol (ESTRACE) 0.5 MG tablet Take 0.5 mg by mouth daily.   Yes [provider]  fluticasone furoate-vilanterol (BREO ELLIPTA) 200-25 MCG/ACT AEPB Inhale 1 puff into the lungs daily.   Yes [provider]  gabapentin (NEURONTIN) 300 MG capsule Take 300 mg by mouth 2 (two) times daily.   Yes [provider]  levocetirizine (XYZAL) 5 MG tablet Take 5 mg by mouth.   Yes [provider]  magnesium oxide (MAG-OX) 400 (240 Mg) MG tablet Take 400 mg by mouth daily.   Yes [provider]  metoprolol succinate (TOPROL-XL) 25 MG 24 hr tablet Take 25 mg by mouth daily.   Yes [provider]  Multiple Vitamins-Minerals (COMPLETE) TABS Take by mouth. 11/24/08  Yes [provider]  Omega-3 Fatty Acids (FISH OIL PO) Take 1 tablet by mouth daily.   Yes [provider]  progesterone (PROMETRIUM) 100 MG capsule Take 100 mg by mouth daily.   Yes [provider]  tiZANidine (ZANAFLEX) 4 MG capsule Take 4 mg by mouth 3 (three) times daily.   Yes [provider]  traZODone (DESYREL) 50 MG tablet Take 50 mg by mouth at bedtime as needed for sleep.   Yes [provider]  PROAIR RESPICLICK 108 (90 Base) MCG/ACT AEPB INHALE 2 PUFF BY MOUTH AS NEEDED EVERY 4-6 HOURS AS NEEDED FOR COUGH/WHEEZE 12/12/16   [provider]   No results found. - Positive ROS: All other systems have been reviewed and were otherwise negative with the exception of those mentioned in the HPI and as above.  Physical Exam: General: No acute distress, resting comfortably Cardiovascular: BUE warm and well perfused, normal rate Respiratory: Normal WOB on RA Skin: Warm  and dry Neurologic: Sensation intact distally Psychiatric: Patient is at baseline mood and affect  Right Upper Extremity  Positive Tinel and Durkan signs at wrist.  Full AROM of fingers.  No thenar atrophy.   Assessment: 58 yo F w/ left carpal tunnel syndrome that has failed conservative management.  Plan: OR today for left carpal tunnel release. We reviewed the risks of surgery which include bleeding, infection, damage to neurovascular structures including the median nerve or it's branches, persistent symptoms, delayed wound healing, need for additional surgery.  Informed consent was signed.  All questions were answered.   Bebe Galla, M.D. [facility] 8:20 AM

## 2023-10-17 NOTE — Op Note (Signed)
   Date of Surgery: 10/17/2023  INDICATIONS: Patient is a 58 y.o.-year-old female with left carpal tunnel syndrome that has failed conservative management.  Risks, benefits, and alternatives to surgery were again discussed with the patient in the preoperative area. The patient wishes to proceed with surgery.  Informed consent was signed after our discussion.   PREOPERATIVE DIAGNOSIS:  Left carpal tunnel syndrome  POSTOPERATIVE DIAGNOSIS: Same.  PROCEDURE:  Left carpal tunnel release   SURGEON: Carlin Galla, M.D.  ASSIST: None  ANESTHESIA:  Local, MAC  IV FLUIDS AND URINE: See anesthesia.  ESTIMATED BLOOD LOSS: <5 mL.  IMPLANTS: * No implants in log *   DRAINS: None  COMPLICATIONS: None noted  DESCRIPTION OF PROCEDURE:  The patient was met in the preoperative holding area where the surgical site was marked and the informed consent form was signed.  The patient was then brought back to the operating room and remained on the stretcher.  A hand table was placed adjacent to the operative extremity and locked into place.  A tourniquet was placed on the left forearm.  A formal timeout was performed to confirm that this was the correct patient, surgical side, surgical site, and surgical procedure.  All were present and in agreement. Following formal timeout, a local block was performed using 10 mL of 0.25% plain marcaine.  The left upper extremity was then prepped and draped in the usual and sterile fashion.   Following a second timeout, the limb was exsanguinated and the tourniquet inflated to 250 mmHg.  A longitudinal incision was made in line with the radial border of the ring finger from distal to the wrist flexion crease to the intersection of Kaplan's cardinal line.  The skin and subcutaneous tissue was sharply divided.  The longitudinally running palmar fascia was incised.  The transverse carpal ligament was identified.  The ligament was divided from proximal to distal until the  fat surrounding the palmar arch was encountered. Retractors were then placed in the proximal aspect of the wound to visualize the distal antebrachial fascia.  The fascia was sharply divided under direct visualization.   The wound was then thoroughly irrigated with sterile saline.  The tourniquet was deflated.  Hemostasis was achieved with direct pressure and bipolar electrocautery.  The wound was then closed with 4-0 vicryl rapide sutures in a horizontal mattress fashion. The wound was then dressed with xeroform, folded kerlix, and an ace wrap.  The patient was reversed from sedation and the drapes taken down.   All counts were correct x 2 at the end of the procedure.  The patient was then taken to the PACU in stable condition.    POSTOPERATIVE PLAN: She will be discharged to home with appropriate pain medication and discharge instructions.  I'll see her back in the office in approximately 14 days for her first postop visit.   Carlin Galla, MD 9:10 AM

## 2023-10-17 NOTE — Anesthesia Preprocedure Evaluation (Signed)
 Anesthesia Evaluation  Patient identified by MRN, date of birth, ID band Patient awake    Reviewed: Allergy & Precautions, NPO status , Patient's Chart, lab work & pertinent test results  History of Anesthesia Complications (+) PONV and history of anesthetic complications  Airway Mallampati: II  TM Distance: >3 FB Neck ROM: Full    Dental no notable dental hx. (+) Teeth Intact   Pulmonary asthma , neg sleep apnea, neg COPD, Patient abstained from smoking.Not current smoker   Pulmonary exam normal breath sounds clear to auscultation       Cardiovascular Exercise Tolerance: Good METS(-) hypertension(-) CAD and (-) Past MI negative cardio ROS (-) dysrhythmias  Rhythm:Regular Rate:Normal - Systolic murmurs    Neuro/Psych  PSYCHIATRIC DISORDERS Anxiety     negative neurological ROS     GI/Hepatic ,neg GERD  ,,(+)     (-) substance abuse    Endo/Other  neg diabetes    Renal/GU negative Renal ROS     Musculoskeletal   Abdominal   Peds  Hematology   Anesthesia Other Findings Past Medical History: No date: Anxiety No date: Asthma No date: Complication of anesthesia No date: Endometriosis No date: Environmental allergies No date: Hormone replacement therapy No date: Multiple food allergies     Comment:  Tomato, shellfish No date: PONV (postoperative nausea and vomiting)  Reproductive/Obstetrics                              Anesthesia Physical Anesthesia Plan  ASA: 2  Anesthesia Plan: MAC   Post-op Pain Management: Minimal or no pain anticipated   Induction: Intravenous  PONV Risk Score and Plan: 3 and Propofol infusion, TIVA, Ondansetron and Midazolam  Airway Management Planned: Nasal Cannula  Additional Equipment: None  Intra-op Plan:   Post-operative Plan:   Informed Consent: I have reviewed the patients History and Physical, chart, labs and discussed the procedure  including the risks, benefits and alternatives for the proposed anesthesia with the patient or authorized representative who has indicated his/her understanding and acceptance.     Dental advisory given  Plan Discussed with: CRNA and Surgeon  Anesthesia Plan Comments: (Discussed risks of anesthesia with patient, including possibility of difficulty with spontaneous ventilation under anesthesia necessitating airway intervention, PONV, and rare risks such as cardiac or respiratory or neurological events, and allergic reactions. Discussed the role of CRNA in patient's perioperative care. Patient understands.)        Anesthesia Quick Evaluation

## 2023-10-17 NOTE — Transfer of Care (Signed)
 Immediate Anesthesia Transfer of Care Note  Patient: Sophia Blair  Procedure(s) Performed: CARPAL TUNNEL RELEASE (Left: Hand)  Patient Location: PACU  Anesthesia Type:MAC  Level of Consciousness: awake, alert , oriented, drowsy, and patient cooperative  Airway & Oxygen Therapy: Patient Spontanous Breathing and Patient connected to face mask  Post-op Assessment: Report given to RN and Post -op Vital signs reviewed and stable  Post vital signs: Reviewed and stable  Last Vitals:  Vitals Value Taken Time  BP    Temp    Pulse 87 10/17/23 09:12  Resp 20 10/17/23 09:12  SpO2 100 % 10/17/23 09:12  Vitals shown include unfiled device data.  Last Pain:  Vitals:   10/17/23 0723  TempSrc: Temporal  PainSc: 3          Complications: No notable events documented.

## 2023-10-17 NOTE — Discharge Instructions (Addendum)
 Carlin Galla, M.D. Hand Surgery  POST-OPERATIVE DISCHARGE INSTRUCTIONS   PRESCRIPTIONS: - You may have been given a prescription to be taken as directed for post-operative pain control.  You may also take over the counter ibuprofen /aleve and tylenol for pain. Take this as directed on the packaging. Do not exceed 3000 mg tylenol/acetaminophen in 24 hours.  Ibuprofen  600-800 mg (3-4) tablets by mouth every 6 hours as needed for pain.   OR  Aleve 2 tablets by mouth every 12 hours (twice daily) as needed for pain.   AND/OR  Tylenol 1000 mg (2 tablets) every 8 hours as needed for pain.  - Please use your pain medication carefully, as refills are limited and you may not be provided with one.  As stated above, please use over the counter pain medicine - it will also be helpful with decreasing your swelling.    ANESTHESIA: -After your surgery, post-surgical discomfort or pain is likely. This discomfort can last several days to a few weeks. At certain times of the day your discomfort may be more intense.    General Anesthesia:  If you did not receive a nerve block during your surgery, you will need to start taking your pain medication shortly after your surgery and should continue to do so as prescribed by your surgeon.     ICE AND ELEVATION: - You may use ice for the first 48-72 hours, but it is not critical.   - Motion of your fingers is very important to decrease the swelling.  - Elevation, as much as possible for the next 48 hours, is critical for decreasing swelling as well as for pain relief. Elevation means when you are seated or lying down, you hand should be at or above your heart. When walking, the hand needs to be at or above the level of your elbow.  - If the bandage gets too tight, it may need to be loosened. Please contact our office and we will instruct you in how to do this.    SURGICAL BANDAGES:  - Keep your dressing and/or splint clean and dry at all times.  You  can remove your dressing 7 days from now and change with a dry dressing or Band-Aids as needed thereafter. - You may place a plastic bag over your bandage to shower, but be careful, do not get your bandages wet.  - After the bandages have been removed, it is OK to get the stitches wet in a shower or with hand washing. Do Not soak or submerge the wound yet. Please do not use lotions or creams on the stitches.      HAND THERAPY:  - You may not need any. If you do, we will begin this at your follow up visit in the clinic.    ACTIVITY AND WORK: - You are encouraged to move any fingers which are not in the bandage.  - Light use of the fingers is allowed to assist the other hand with daily hygiene and eating, but strong gripping or lifting is often uncomfortable and should be avoided.  - You might miss a variable period of time from work and hopefully this issue has been discussed prior to surgery. You may not do any heavy work with your affected hand for about 2 weeks.    [facility] Second Floor, [address] [place], [place] [address] [phone]    Post Anesthesia Home Care Instructions  Activity: Get plenty of rest for the remainder of the day.  A responsible individual must stay with you for 24 hours following the procedure.  For the next 24 hours, DO NOT: -Drive a car -Advertising copywriter -Drink alcoholic beverages -Take any medication unless instructed by your physician -Make any legal decisions or sign important papers.  Meals: Start with liquid foods such as gelatin or soup. Progress to regular foods as tolerated. Avoid greasy, spicy, heavy foods. If nausea and/or vomiting occur, drink only clear liquids until the nausea and/or vomiting subsides. Call your physician if vomiting continues.  Special Instructions/Symptoms: Your throat may feel dry or sore from the anesthesia or the breathing tube placed in your throat during surgery. If this causes discomfort, gargle  with warm salt water. The discomfort should disappear within 24 hours.

## 2023-10-18 ENCOUNTER — Encounter (HOSPITAL_BASED_OUTPATIENT_CLINIC_OR_DEPARTMENT_OTHER): Payer: Self-pay | Admitting: Orthopedic Surgery

## 2023-10-18 NOTE — Anesthesia Postprocedure Evaluation (Signed)
 Anesthesia Post Note  Patient: Sophia Blair  Procedure(s) Performed: CARPAL TUNNEL RELEASE (Left: Hand)     Patient location during evaluation: PACU Anesthesia Type: MAC Level of consciousness: awake and alert Pain management: pain level controlled Vital Signs Assessment: post-procedure vital signs reviewed and stable Respiratory status: spontaneous breathing, nonlabored ventilation, respiratory function stable and patient connected to nasal cannula oxygen Cardiovascular status: stable and blood pressure returned to baseline Postop Assessment: no apparent nausea or vomiting Anesthetic complications: no   No notable events documented.  Last Vitals:  Vitals:   10/17/23 0915 10/17/23 0934  BP: (!) 144/76 135/84  Pulse: 80 76  Resp: 17 16  Temp: (!) 36.3 C (!) 36.2 C  SpO2: 100% 99%    Last Pain:  Vitals:   10/17/23 0934  TempSrc: Temporal  PainSc: 5                  [place] Ade
